# Patient Record
Sex: Female | Born: 1950 | Race: Black or African American | Hispanic: No | Marital: Single | Smoking: Former smoker
Health system: Southern US, Community
[De-identification: ages and names within clinical notes are randomized; demographics above are authoritative.]

## PROBLEM LIST (undated history)

## (undated) DIAGNOSIS — I447 Left bundle-branch block, unspecified: Secondary | ICD-10-CM

## (undated) DIAGNOSIS — I10 Essential (primary) hypertension: Secondary | ICD-10-CM

## (undated) DIAGNOSIS — I2510 Atherosclerotic heart disease of native coronary artery without angina pectoris: Secondary | ICD-10-CM

## (undated) DIAGNOSIS — E785 Hyperlipidemia, unspecified: Secondary | ICD-10-CM

## (undated) DIAGNOSIS — E119 Type 2 diabetes mellitus without complications: Secondary | ICD-10-CM

## (undated) HISTORY — DX: Atherosclerotic heart disease of native coronary artery without angina pectoris: I25.10

## (undated) HISTORY — DX: Type 2 diabetes mellitus without complications: E11.9

## (undated) HISTORY — PX: ABDOMINAL HYSTERECTOMY: SHX81

## (undated) HISTORY — DX: Essential (primary) hypertension: I10

## (undated) HISTORY — DX: Hyperlipidemia, unspecified: E78.5

## (undated) HISTORY — DX: Left bundle-branch block, unspecified: I44.7

---

## 2020-12-04 DIAGNOSIS — Z131 Encounter for screening for diabetes mellitus: Secondary | ICD-10-CM | POA: Diagnosis not present

## 2020-12-04 DIAGNOSIS — Z1321 Encounter for screening for nutritional disorder: Secondary | ICD-10-CM | POA: Diagnosis not present

## 2020-12-04 DIAGNOSIS — Z136 Encounter for screening for cardiovascular disorders: Secondary | ICD-10-CM | POA: Diagnosis not present

## 2020-12-04 DIAGNOSIS — Z0001 Encounter for general adult medical examination with abnormal findings: Secondary | ICD-10-CM | POA: Diagnosis not present

## 2020-12-04 DIAGNOSIS — I10 Essential (primary) hypertension: Secondary | ICD-10-CM | POA: Diagnosis not present

## 2020-12-04 DIAGNOSIS — Z1329 Encounter for screening for other suspected endocrine disorder: Secondary | ICD-10-CM | POA: Diagnosis not present

## 2020-12-11 ENCOUNTER — Encounter: Payer: Self-pay | Admitting: Gastroenterology

## 2020-12-12 DIAGNOSIS — E782 Mixed hyperlipidemia: Secondary | ICD-10-CM | POA: Diagnosis not present

## 2020-12-12 DIAGNOSIS — R7303 Prediabetes: Secondary | ICD-10-CM | POA: Diagnosis not present

## 2020-12-12 DIAGNOSIS — I10 Essential (primary) hypertension: Secondary | ICD-10-CM | POA: Diagnosis not present

## 2020-12-12 DIAGNOSIS — D751 Secondary polycythemia: Secondary | ICD-10-CM | POA: Diagnosis not present

## 2020-12-13 ENCOUNTER — Other Ambulatory Visit: Payer: Self-pay | Admitting: Internal Medicine

## 2020-12-13 DIAGNOSIS — R5381 Other malaise: Secondary | ICD-10-CM

## 2020-12-13 DIAGNOSIS — Z1231 Encounter for screening mammogram for malignant neoplasm of breast: Secondary | ICD-10-CM

## 2020-12-18 NOTE — Progress Notes (Signed)
Date:  12/19/2020   ID:  Zoe Cooper, DOB 09/05/1950, MRN [id number]  PCP:  Zoe Mccreedy, MD  Cardiologist:  Zoe Kras, DO, [facility]  (established care 12/19/2020)  REASON FOR CONSULT: Abnormal electrocardiogram   REQUESTING PHYSICIAN:  Zoe Mccreedy, MD [address] [place],  [place] [address]  Chief Complaint  Patient presents with   Abnormal ECG   New Patient (Initial Visit)    HPI  Zoe Cooper is a 70 y.o. female who presents to the office with a chief complaint of " abnormal EKG and establish care." Patient's past medical history and cardiovascular risk factors include: Hypertension, left bundle branch block, post menopausal female, advance age.   She is referred to the office at the request of Zoe Cooper, Zoe Beard, MD for evaluation of abnormal electrocardiogram.  Patient recently moved from [facility] to [place] and established care with PCP and now referred to cardiology for evaluation of an abnormal electrocardiogram and hypertensive heart disease.  Patient denies any chest pain or shortness of breath at rest or with effort related activities.  EKG notes sinus bradycardia with underlying left bundle branch block.  The chronicity of the left bundle branch block is unknown at the current time.  She denies any history of CAD/CVA/PE/DVT/congestive heart failure.  Review of systems are positive for lightheaded and dizziness.  She takes all her medications in the morning.  FUNCTIONAL STATUS: No structured exercise program or daily routine.   ALLERGIES: No Known Allergies  MEDICATION LIST PRIOR TO VISIT: Current Meds  Medication Sig   amLODipine-olmesartan (AZOR) 5-20 MG tablet Take 1 tablet by mouth daily.   diphenhydramine-acetaminophen (TYLENOL PM EXTRA STRENGTH) 25-500 MG TABS tablet Take 1 tablet by mouth at bedtime as needed.   hydrochlorothiazide (HYDRODIURIL) 25 MG tablet Take 1 tablet (25 mg total) by mouth every morning.    metoprolol succinate (TOPROL XL) 25 MG 24 hr tablet Take 1 tablet (25 mg total) by mouth every morning.   [DISCONTINUED] atenolol-chlorthalidone (TENORETIC) 50-25 MG tablet Take 1 tablet by mouth daily.     PAST MEDICAL HISTORY: Past Medical History:  Diagnosis Date   Hypertension    LBBB (left bundle branch block)     PAST SURGICAL HISTORY: Past Surgical History:  Procedure Laterality Date   ABDOMINAL HYSTERECTOMY      FAMILY HISTORY: The patient family history includes Aneurysm in her sister; Cancer in her sister; Heart attack in her father; Lung cancer in her brother.  SOCIAL HISTORY:  The patient  reports that she has quit smoking. Her smoking use included cigarettes. She smoked an average of .25 packs per day. She has never used smokeless tobacco. She reports previous alcohol use. She reports that she does not use drugs.  REVIEW OF SYSTEMS: Review of Systems  Constitutional: Negative for chills and fever.  HENT:  Negative for hoarse voice and nosebleeds.   Eyes:  Negative for discharge, double vision and pain.  Cardiovascular:  Negative for chest pain, claudication, dyspnea on exertion, leg swelling, near-syncope, orthopnea, palpitations, paroxysmal nocturnal dyspnea and syncope.  Respiratory:  Negative for hemoptysis and shortness of breath.   Musculoskeletal:  Negative for muscle cramps and myalgias.  Gastrointestinal:  Negative for abdominal pain, constipation, diarrhea, hematemesis, hematochezia, melena, nausea and vomiting.  Neurological:  Positive for light-headedness. Negative for dizziness.   PHYSICAL EXAM: Vitals with BMI 12/19/2020  Height 5' 6"  Weight 182 lbs  BMI 19.62  Systolic 229  Diastolic 78  Pulse 51  CONSTITUTIONAL: Well-developed and well-nourished. No acute distress.  SKIN: Skin is warm and dry. No rash noted. No cyanosis. No pallor. No jaundice HEAD: Normocephalic and atraumatic.  EYES: No scleral icterus MOUTH/THROAT: Moist oral membranes.   NECK: No JVD present. No thyromegaly noted. No carotid bruits  LYMPHATIC: No visible cervical adenopathy.  CHEST Normal respiratory effort. No intercostal retractions  LUNGS: Clear to auscultation bilaterally.  No stridor. No wheezes. No rales.  CARDIOVASCULAR: Regular, bradycardia, positive S1-S2, no murmurs rubs or gallops appreciated. ABDOMINAL: Soft, nontender, nondistended, positive bowel sounds all 4 quadrants, no apparent ascites.  EXTREMITIES: No peripheral edema  HEMATOLOGIC: No significant bruising NEUROLOGIC: Oriented to person, place, and time. Nonfocal. Normal muscle tone.  PSYCHIATRIC: Normal mood and affect. Normal behavior. Cooperative  CARDIAC DATABASE: EKG: 12/19/2020: Marked sinus bradycardia, 49 bpm, left axis deviation, left bundle branch block.    Echocardiogram: No results found for this or any previous visit from the past 1095 days.   Stress Testing: No results found for this or any previous visit from the past 1095 days.  Heart Catheterization: None  LABORATORY DATA: No flowsheet data found.  No flowsheet data found.  Lipid Panel  No results found for: CHOL, TRIG, HDL, CHOLHDL, VLDL, LDLCALC, LDLDIRECT, LABVLDL  No components found for: NTPROBNP No results for input(s): PROBNP in the last 8760 hours. No results for input(s): TSH in the last 8760 hours.  BMP No results for input(s): NA, K, CL, CO2, GLUCOSE, BUN, CREATININE, CALCIUM, GFRNONAA, GFRAA in the last 8760 hours.  HEMOGLOBIN A1C No results found for: HGBA1C, MPG  External Labs:  Date Collected: 12/09/2020 , information obtained by PCP Potassium: 4.0 Creatinine 0.97 mg/dL. eGFR: 63 mL/min per 1.73 m Hemoglobin: 15.6 g/dL and hematocrit: 47.7 % Lipid profile: Total cholesterol 198 , triglycerides 90 , HDL 61 , LDL 118 AST: 17 , ALT: 8 , alkaline phosphatase: 66  Hemoglobin A1c: 6.3 TSH: 1.12    IMPRESSION:    ICD-10-CM   1. Nonspecific abnormal electrocardiogram (ECG) (EKG)   R94.31 EKG 12-Lead    2. Bradycardia, sinus  R00.1 PCV MYOCARDIAL PERFUSION WO LEXISCAN    metoprolol succinate (TOPROL XL) 25 MG 24 hr tablet    3. LBBB (left bundle branch block)  I44.7 PCV MYOCARDIAL PERFUSION WO LEXISCAN    4. Benign hypertension  I10 PCV ECHOCARDIOGRAM COMPLETE    metoprolol succinate (TOPROL XL) 25 MG 24 hr tablet    hydrochlorothiazide (HYDRODIURIL) 25 MG tablet    Basic metabolic panel    Magnesium       RECOMMENDATIONS: Solana Coggin is a 70 y.o. female whose past medical history and cardiac risk factors include: Hypertension, left bundle branch block, post menopausal female, advance age.   Patient presents for evaluation of an abnormal EKG.  EKG notes normal sinus rhythm with underlying left bundle branch block.  Patient is unaware of the chronicity of her LBBB.  At times she does experience tired, fatigue, lightheaded and dizziness.  I suspect this is most likely secondary to underlying sinus bradycardia.  Is currently on atenolol 50 mg/chlorthalidone 25 mg p.o. daily.  At her age and given her symptoms would recommend down titrating her medications.  We will transition her to Toprol-XL 25 mg p.o. every morning and hydrochlorothiazide 25 mg p.o. every morning.  Would like her to get blood work done in 1 week to evaluate kidney function and electrolytes.  Given her age, risk factors and left bundle branch block recommend exercise nuclear stress test to  evaluate for underlying reversible ischemia.  I have asked her to hold metoprolol 24 hours prior to stress testing.  Further recommendations to follow as case evolves.  Thank you for allowing [place] to participate in the care of this pleasant patient please do not hesitate to reach out if any questions or concerns arise.  FINAL MEDICATION LIST END OF ENCOUNTER: Meds ordered this encounter  Medications   metoprolol succinate (TOPROL XL) 25 MG 24 hr tablet    Sig: Take 1 tablet (25 mg total) by mouth every morning.     Dispense:  90 tablet    Refill:  0   hydrochlorothiazide (HYDRODIURIL) 25 MG tablet    Sig: Take 1 tablet (25 mg total) by mouth every morning.    Dispense:  90 tablet    Refill:  0     Medications Discontinued During This Encounter  Medication Reason   atenolol-chlorthalidone (TENORETIC) 50-25 MG tablet      Current Outpatient Medications:    amLODipine-olmesartan (AZOR) 5-20 MG tablet, Take 1 tablet by mouth daily., Disp: , Rfl:    diphenhydramine-acetaminophen (TYLENOL PM EXTRA STRENGTH) 25-500 MG TABS tablet, Take 1 tablet by mouth at bedtime as needed., Disp: , Rfl:    hydrochlorothiazide (HYDRODIURIL) 25 MG tablet, Take 1 tablet (25 mg total) by mouth every morning., Disp: 90 tablet, Rfl: 0   metoprolol succinate (TOPROL XL) 25 MG 24 hr tablet, Take 1 tablet (25 mg total) by mouth every morning., Disp: 90 tablet, Rfl: 0  Orders Placed This Encounter  Procedures   Basic metabolic panel   Magnesium   PCV MYOCARDIAL PERFUSION WO LEXISCAN   EKG 12-Lead   PCV ECHOCARDIOGRAM COMPLETE    Patient Instructions  Please provide the patient instructions on the nearest [facility].  Hold Metoprolol for 24hr before your stress test.    --Continue cardiac medications as reconciled in final medication list. --Return in about 6 weeks (around 01/30/2021) for Follow up LBBB, Review test results. Or sooner if needed. --Continue follow-up with your primary care physician regarding the management of your other chronic comorbid conditions.  Patient's questions and concerns were addressed to her satisfaction. She voices understanding of the instructions provided during this encounter.   This note was created using a voice recognition software as a result there may be grammatical errors inadvertently enclosed that do not reflect the nature of this encounter. Every attempt is made to correct such errors.  Zoe Cooper, [place], [facility]  Pager: [phone] Office: [phone]

## 2020-12-19 ENCOUNTER — Ambulatory Visit: Payer: Medicare Other | Admitting: Cardiology

## 2020-12-19 ENCOUNTER — Encounter: Payer: Self-pay | Admitting: Cardiology

## 2020-12-19 ENCOUNTER — Other Ambulatory Visit: Payer: Self-pay

## 2020-12-19 VITALS — BP 146/78 | HR 51 | Temp 97.6°F | Resp 16 | Ht 66.0 in | Wt 182.0 lb

## 2020-12-19 DIAGNOSIS — R001 Bradycardia, unspecified: Secondary | ICD-10-CM | POA: Diagnosis not present

## 2020-12-19 DIAGNOSIS — I447 Left bundle-branch block, unspecified: Secondary | ICD-10-CM | POA: Diagnosis not present

## 2020-12-19 DIAGNOSIS — R9431 Abnormal electrocardiogram [ECG] [EKG]: Secondary | ICD-10-CM | POA: Diagnosis not present

## 2020-12-19 DIAGNOSIS — I10 Essential (primary) hypertension: Secondary | ICD-10-CM | POA: Diagnosis not present

## 2020-12-19 MED ORDER — METOPROLOL SUCCINATE ER 25 MG PO TB24: 25.0000 mg | ORAL_TABLET | Freq: Every morning | ORAL | 0 refills | Status: DC

## 2020-12-19 MED ORDER — HYDROCHLOROTHIAZIDE 25 MG PO TABS: 25.0000 mg | ORAL_TABLET | Freq: Every morning | ORAL | 0 refills | Status: DC

## 2020-12-19 NOTE — Patient Instructions (Addendum)
Please provide the patient instructions on the nearest [facility].  Hold Metoprolol for 24hr before your stress test.

## 2020-12-23 ENCOUNTER — Other Ambulatory Visit: Payer: Self-pay

## 2020-12-23 DIAGNOSIS — I10 Essential (primary) hypertension: Secondary | ICD-10-CM

## 2020-12-26 ENCOUNTER — Other Ambulatory Visit: Payer: Medicare Other

## 2021-01-13 ENCOUNTER — Other Ambulatory Visit: Payer: Medicare Other

## 2021-01-30 ENCOUNTER — Ambulatory Visit: Payer: Medicare Other | Admitting: Cardiology

## 2021-02-03 ENCOUNTER — Ambulatory Visit: Payer: Self-pay

## 2021-02-19 ENCOUNTER — Other Ambulatory Visit: Payer: Medicare Other

## 2021-02-24 ENCOUNTER — Encounter: Payer: Self-pay | Admitting: Gastroenterology

## 2021-02-27 ENCOUNTER — Ambulatory Visit: Payer: Medicare Other | Admitting: Cardiology

## 2021-03-03 ENCOUNTER — Other Ambulatory Visit: Payer: Self-pay

## 2021-03-03 ENCOUNTER — Ambulatory Visit: Payer: Medicare Other

## 2021-03-03 DIAGNOSIS — I10 Essential (primary) hypertension: Secondary | ICD-10-CM

## 2021-03-03 DIAGNOSIS — R001 Bradycardia, unspecified: Secondary | ICD-10-CM | POA: Diagnosis not present

## 2021-03-03 DIAGNOSIS — I447 Left bundle-branch block, unspecified: Secondary | ICD-10-CM | POA: Diagnosis not present

## 2021-03-17 NOTE — Progress Notes (Signed)
Patient is aware 

## 2021-03-18 ENCOUNTER — Other Ambulatory Visit: Payer: Self-pay | Admitting: Cardiology

## 2021-03-18 ENCOUNTER — Ambulatory Visit: Payer: Medicare Other | Admitting: Cardiology

## 2021-03-18 ENCOUNTER — Other Ambulatory Visit: Payer: Self-pay

## 2021-03-18 ENCOUNTER — Encounter: Payer: Self-pay | Admitting: Cardiology

## 2021-03-18 VITALS — BP 129/89 | HR 98 | Resp 16 | Ht 66.0 in | Wt 186.0 lb

## 2021-03-18 DIAGNOSIS — I447 Left bundle-branch block, unspecified: Secondary | ICD-10-CM | POA: Diagnosis not present

## 2021-03-18 DIAGNOSIS — I10 Essential (primary) hypertension: Secondary | ICD-10-CM

## 2021-03-18 DIAGNOSIS — R9439 Abnormal result of other cardiovascular function study: Secondary | ICD-10-CM | POA: Diagnosis not present

## 2021-03-18 DIAGNOSIS — R9431 Abnormal electrocardiogram [ECG] [EKG]: Secondary | ICD-10-CM

## 2021-03-18 DIAGNOSIS — R001 Bradycardia, unspecified: Secondary | ICD-10-CM | POA: Diagnosis not present

## 2021-03-18 MED ORDER — METOPROLOL TARTRATE 50 MG PO TABS
50.0000 mg | ORAL_TABLET | Freq: Two times a day (BID) | ORAL | 0 refills | Status: DC
Start: 2021-03-18 — End: 2021-05-07

## 2021-03-18 NOTE — Progress Notes (Signed)
 Date:  03/18/2021   ID:  Zoe Cooper, DOB 07/30/1950, MRN [id number]  PCP:  Osei-Bonsu, George, MD  Cardiologist:  Sunit Tolia, DO, FACC  (established care 12/19/2020)  Date: 03/18/21 Last Office Visit: 12/19/2020   Chief Complaint  Patient presents with   Left bundle branch block   Results   Follow-up    HPI  Zoe Cooper is a 70 y.o. female who presents to the office with a chief complaint of " follow up to review test results." Patient's past medical history and cardiovascular risk factors include: Hypertension, left bundle branch block, post menopausal female, advance age.   She is referred to the office at the request of Osei-Bonsu, George, MD for evaluation of abnormal electrocardiogram.  After recently moving from [place] to [place] she will establish care with PCP and was referred to cardiology given the abnormal EKG.  Given her underlying left bundle branch block of unknown chronicity and prolonged hypertension she underwent an echocardiogram and stress test with further evaluation and management.  The echocardiogram noted hyperdynamic LVEF with severe left ventricular hypertrophy and nuclear stress test notes perfusion defect involving the inferior and inferoseptal myocardium but overall sensitivity is reduced due to breast tissue attenuation artifact, LVH, and left bundle branch block.  Clinically she denies any chest pain at rest or with effort and rate activities.    FUNCTIONAL STATUS: No structured exercise program or daily routine.   ALLERGIES: No Known Allergies  MEDICATION LIST PRIOR TO VISIT: Current Meds  Medication Sig   amLODipine-olmesartan (AZOR) 5-20 MG tablet Take 1 tablet by mouth daily.   diphenhydramine-acetaminophen (TYLENOL PM EXTRA STRENGTH) 25-500 MG TABS tablet Take 1 tablet by mouth at bedtime as needed.   hydrochlorothiazide (HYDRODIURIL) 25 MG tablet Take 1 tablet (25 mg total) by mouth every morning.   metoprolol tartrate  (LOPRESSOR) 50 MG tablet Take 1 tablet (50 mg total) by mouth 2 (two) times daily. Hold if systolic blood pressure (top number) less than 100 mmHg or pulse less than 60 bpm.   [DISCONTINUED] metoprolol succinate (TOPROL XL) 25 MG 24 hr tablet Take 1 tablet (25 mg total) by mouth every morning.     PAST MEDICAL HISTORY: Past Medical History:  Diagnosis Date   Hypertension    LBBB (left bundle branch block)     PAST SURGICAL HISTORY: Past Surgical History:  Procedure Laterality Date   ABDOMINAL HYSTERECTOMY      FAMILY HISTORY: The patient family history includes Aneurysm in her sister; Cancer in her sister; Heart attack in her father; Lung cancer in her brother.  SOCIAL HISTORY:  The patient  reports that she has quit smoking. Her smoking use included cigarettes. She smoked an average of .25 packs per day. She has never used smokeless tobacco. She reports that she does not currently use alcohol. She reports that she does not use drugs.  REVIEW OF SYSTEMS: Review of Systems  Constitutional: Negative for chills and fever.  HENT:  Negative for hoarse voice and nosebleeds.   Eyes:  Negative for discharge, double vision and pain.  Cardiovascular:  Negative for chest pain, claudication, dyspnea on exertion, leg swelling, near-syncope, orthopnea, palpitations, paroxysmal nocturnal dyspnea and syncope.  Respiratory:  Negative for hemoptysis and shortness of breath.   Musculoskeletal:  Negative for muscle cramps and myalgias.  Gastrointestinal:  Negative for abdominal pain, constipation, diarrhea, hematemesis, hematochezia, melena, nausea and vomiting.  Neurological:  Positive for light-headedness. Negative for dizziness.   PHYSICAL EXAM: Vitals with BMI 03/18/2021   03/18/2021 12/19/2020  Height - 5' 6" 5' 6"  Weight - 186 lbs 182 lbs  BMI - 40.81 44.81  Systolic 856 314 970  Diastolic 89 91 78  Pulse 98 101 51    CONSTITUTIONAL: Well-developed and well-nourished. No acute distress.   SKIN: Skin is warm and dry. No rash noted. No cyanosis. No pallor. No jaundice HEAD: Normocephalic and atraumatic.  EYES: No scleral icterus MOUTH/THROAT: Moist oral membranes.  NECK: No JVD present. No thyromegaly noted. No carotid bruits  LYMPHATIC: No visible cervical adenopathy.  CHEST Normal respiratory effort. No intercostal retractions  LUNGS: Clear to auscultation bilaterally.  No stridor. No wheezes. No rales.  CARDIOVASCULAR: Regular, bradycardia, positive S1-S2, no murmurs rubs or gallops appreciated. ABDOMINAL: Soft, nontender, nondistended, positive bowel sounds all 4 quadrants, no apparent ascites.  EXTREMITIES: No peripheral edema  HEMATOLOGIC: No significant bruising NEUROLOGIC: Oriented to person, place, and time. Nonfocal. Normal muscle tone.  PSYCHIATRIC: Normal mood and affect. Normal behavior. Cooperative  CARDIAC DATABASE: EKG: 12/19/2020: Marked sinus bradycardia, 49 bpm, left axis deviation, left bundle branch block.    Echocardiogram: 03/03/2021:  Hyperdynamic LV systolic function with visual EF >70%. Left ventricle cavity is normal in size. Severe left ventricular hypertrophy. Normal global wall motion. Normal diastolic filling pattern, normal LAP.  Mild tricuspid regurgitation. No evidence of pulmonary hypertension.  No prior study for comparison.   Stress Testing: Lexiscan/modified Bruce Tetrofosmin stress test 03/03/2021: Lexiscan/modified Bruce nuclear stress test performed using 1-day protocol. Patient walked for 2.2 METS and reached 71% MPHR. No chest pain reported, dyspnea reported.  SPECT images show significant breast tissue attenuation in inferior myocardium , with imaging performed in sitting position. Thus, ischemia in this region cannot be excluded.  Stress LVEF 60%.  Given TID of 1.3, clinical correlation is recommended.   Heart Catheterization: None  LABORATORY DATA: No flowsheet data found.  No flowsheet data found.  Lipid Panel  No  results found for: CHOL, TRIG, HDL, CHOLHDL, VLDL, LDLCALC, LDLDIRECT, LABVLDL  No components found for: NTPROBNP No results for input(s): PROBNP in the last 8760 hours. No results for input(s): TSH in the last 8760 hours.  BMP No results for input(s): NA, K, CL, CO2, GLUCOSE, BUN, CREATININE, CALCIUM, GFRNONAA, GFRAA in the last 8760 hours.  HEMOGLOBIN A1C No results found for: HGBA1C, MPG  External Labs:  Date Collected: 12/09/2020 , information obtained by PCP Potassium: 4.0 Creatinine 0.97 mg/dL. eGFR: 63 mL/min per 1.73 m Hemoglobin: 15.6 g/dL and hematocrit: 47.7 % Lipid profile: Total cholesterol 198 , triglycerides 90 , HDL 61 , LDL 118 AST: 17 , ALT: 8 , alkaline phosphatase: 66  Hemoglobin A1c: 6.3 TSH: 1.12    IMPRESSION:    ICD-10-CM   1. Abnormal nuclear stress test  R94.39 metoprolol tartrate (LOPRESSOR) 50 MG tablet    Basic metabolic panel    CT CORONARY MORPH W/CTA COR W/SCORE W/CA W/CM &/OR WO/CM    2. LBBB (left bundle branch block)  I44.7 metoprolol tartrate (LOPRESSOR) 50 MG tablet    CT CORONARY MORPH W/CTA COR W/SCORE W/CA W/CM &/OR WO/CM    3. Benign hypertension  I10     4. Bradycardia, sinus  R00.1     5. Abnormal electrocardiogram  R94.31         RECOMMENDATIONS: Zamorah Ailes is a 70 y.o. female whose past medical history and cardiac risk factors include: Hypertension, left bundle branch block, post menopausal female, advance age.   Patient was referred to the office for  evaluation given her underlying LVH bundle branch block.  Given her unknown chronicity of LBBB she underwent stress testing to evaluate for reversible ischemia.  She was noted to have perfusion defect involving the inferior/inferoseptal segments.  Results including the images were reviewed with the patient in great detail at today's office visit.  I explained to her in great detail that the perfusion defect involving the septum will is most likely secondary to LBBB; however,  the perfusion defect involving the inferior myocardium may be due to questionable ischemia, LVH, and soft tissue attenuation due to breast as the images were taken in the sitting position.    We discussed up titration of antianginal therapy and watching her closely with frequent follow-up visits versus proceeding with coronary CTA to evaluate for obstructive disease.  Patient states that she would like to proceed with coronary CTA.  Check BMP.  Discontinue Toprol-XL 25 mg p.o. daily.  We will start Lopressor 50 mg p.o. twice daily with holding parameters.    Her LVH noted on surface echocardiogram was most likely secondary to uncontrolled hypertension in the past.  We discussed considering cardiac MRI to evaluate for HCM versus infiltrative cardiomyopathy; however, she would like to hold off on cardiac MRI at this time.  Further recommendations to follow.  FINAL MEDICATION LIST END OF ENCOUNTER: Meds ordered this encounter  Medications   metoprolol tartrate (LOPRESSOR) 50 MG tablet    Sig: Take 1 tablet (50 mg total) by mouth 2 (two) times daily. Hold if systolic blood pressure (top number) less than 100 mmHg or pulse less than 60 bpm.    Dispense:  60 tablet    Refill:  0     Medications Discontinued During This Encounter  Medication Reason   metoprolol succinate (TOPROL XL) 25 MG 24 hr tablet Change in therapy    Current Outpatient Medications:    amLODipine-olmesartan (AZOR) 5-20 MG tablet, Take 1 tablet by mouth daily., Disp: , Rfl:    diphenhydramine-acetaminophen (TYLENOL PM EXTRA STRENGTH) 25-500 MG TABS tablet, Take 1 tablet by mouth at bedtime as needed., Disp: , Rfl:    hydrochlorothiazide (HYDRODIURIL) 25 MG tablet, Take 1 tablet (25 mg total) by mouth every morning., Disp: 90 tablet, Rfl: 0   metoprolol tartrate (LOPRESSOR) 50 MG tablet, Take 1 tablet (50 mg total) by mouth 2 (two) times daily. Hold if systolic blood pressure (top number) less than 100 mmHg or pulse less than  60 bpm., Disp: 60 tablet, Rfl: 0  Orders Placed This Encounter  Procedures   CT CORONARY MORPH W/CTA COR W/SCORE W/CA W/CM &/OR WO/CM   Basic metabolic panel    There are no Patient Instructions on file for this visit.   --Continue cardiac medications as reconciled in final medication list. --Return in about 4 weeks (around 04/15/2021) for Review CCTA. Or sooner if needed. --Continue follow-up with your primary care physician regarding the management of your other chronic comorbid conditions.  Patient's questions and concerns were addressed to her satisfaction. She voices understanding of the instructions provided during this encounter.   This note was created using a voice recognition software as a result there may be grammatical errors inadvertently enclosed that do not reflect the nature of this encounter. Every attempt is made to correct such errors.  Rex Kras, [place], [facility]  Pager: [phone] Office: [phone]

## 2021-04-09 DIAGNOSIS — R9439 Abnormal result of other cardiovascular function study: Secondary | ICD-10-CM | POA: Diagnosis not present

## 2021-04-10 LAB — BASIC METABOLIC PANEL
BUN/Creatinine Ratio: 16 (ref 12–28)
BUN: 18 mg/dL (ref 8–27)
CO2: 28 mmol/L (ref 20–29)
Calcium: 10.6 mg/dL — ABNORMAL HIGH (ref 8.7–10.3)
Chloride: 97 mmol/L (ref 96–106)
Creatinine, Ser: 1.11 mg/dL — ABNORMAL HIGH (ref 0.57–1.00)
Glucose: 82 mg/dL (ref 70–99)
Potassium: 4.6 mmol/L (ref 3.5–5.2)
Sodium: 143 mmol/L (ref 134–144)
eGFR: 53 mL/min/{1.73_m2} — ABNORMAL LOW (ref >59)

## 2021-04-11 ENCOUNTER — Telehealth (HOSPITAL_COMMUNITY): Payer: Self-pay | Admitting: Emergency Medicine

## 2021-04-11 NOTE — Telephone Encounter (Signed)
Reaching out to patient to offer assistance regarding upcoming cardiac imaging study; pt verbalizes understanding of appt date/time, parking situation and where to check in, pre-test NPO status and medications ordered, and verified current allergies; name and call back number provided for further questions should they arise Rockwell Alexandria RN Navigator Cardiac Imaging [facility] [phone] office [phone] cell  50mg  metop BID Holding HCTZ Arrival time 130p Denies IV issues

## 2021-04-15 ENCOUNTER — Ambulatory Visit (HOSPITAL_COMMUNITY)
Admission: RE | Admit: 2021-04-15 | Discharge: 2021-04-15 | Disposition: A | Discharge status: Home / Self Care | Payer: Medicare Other | Source: Ambulatory Visit | Attending: Cardiology | Admitting: Cardiology

## 2021-04-15 ENCOUNTER — Other Ambulatory Visit: Payer: Self-pay

## 2021-04-15 DIAGNOSIS — I700 Atherosclerosis of aorta: Secondary | ICD-10-CM | POA: Diagnosis not present

## 2021-04-15 DIAGNOSIS — I2510 Atherosclerotic heart disease of native coronary artery without angina pectoris: Secondary | ICD-10-CM | POA: Insufficient documentation

## 2021-04-15 DIAGNOSIS — R9439 Abnormal result of other cardiovascular function study: Secondary | ICD-10-CM | POA: Insufficient documentation

## 2021-04-15 DIAGNOSIS — I447 Left bundle-branch block, unspecified: Secondary | ICD-10-CM | POA: Diagnosis not present

## 2021-04-15 IMAGING — CT CT HEART MORP W/ CTA COR W/ SCORE W/ CA W/CM &/OR W/O CM
2 of 8 series · 5 of 20 positions shown, 6 images · IV contrast (APPLIED)
Comparison: None.
COMPARISON: None.

Addendum:
EXAM:
OVER-READ INTERPRETATION  CT CHEST

The following report is an over-read performed by radiologist Dr.
over-read does not include interpretation of cardiac or coronary
anatomy or pathology. The coronary CTA interpretation by the
cardiologist is attached.
HISTORY: ECG abnormal, 10yr CHD risk < 10%, not treadmill candidate
Cardiac/Coronary  CT
TECHNIQUE: The patient was scanned on a Siemens Force scanner.
PROTOCOL: A 120 kV prospective scan was triggered in the descending thoracic
aorta at 111 HU's. Axial non-contrast 3 mm slices were carried out
through the heart. The data set was analyzed on a dedicated work
station and scored using the Agatson method. Gantry rotation speed
was 250 msecs and collimation was .6 mm. IV beta blockade and 0.8 mg
of sl NTG was given. The 3D data set was reconstructed in 10%
intervals of the 0-90 % of the R-R cycle. Diastolic phases were
analyzed on a dedicated work station using MPR, MIP and VRT modes.
The patient received 95mL OMNIPAQUE IOHEXOL 350 MG/ML SOLN of
contrast.

[Series 6: ts diast sharp · axial · 0.39mm/px · z∈[+1139,+1172]mm · 2 of 251 slices shown]
[im 84/251  lung]
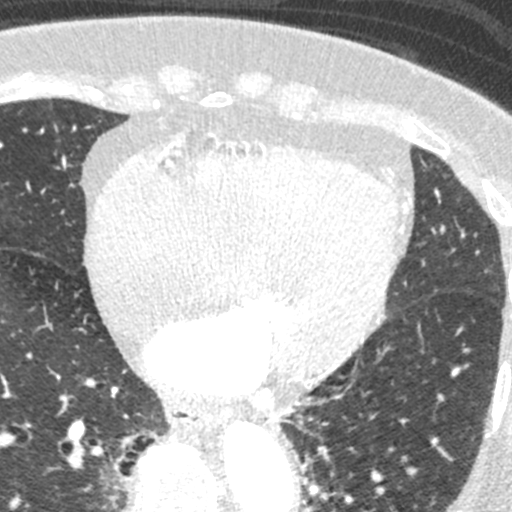
[im 167/251  lung]
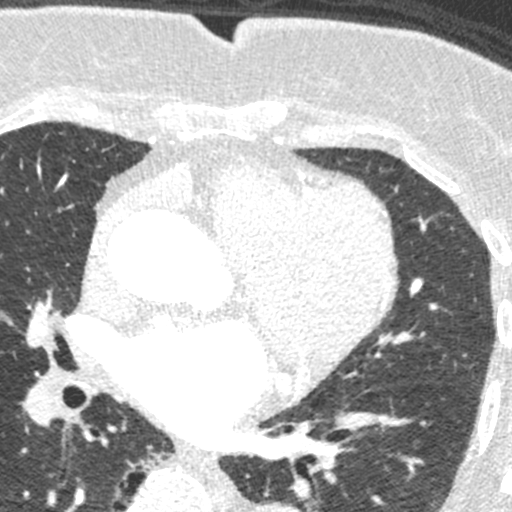

[Series 11: ts diast · axial · 0.39mm/px · z∈[+1104,+1205]mm · 3 of 253 slices shown, 4 images]
[im 1/253  vessel]
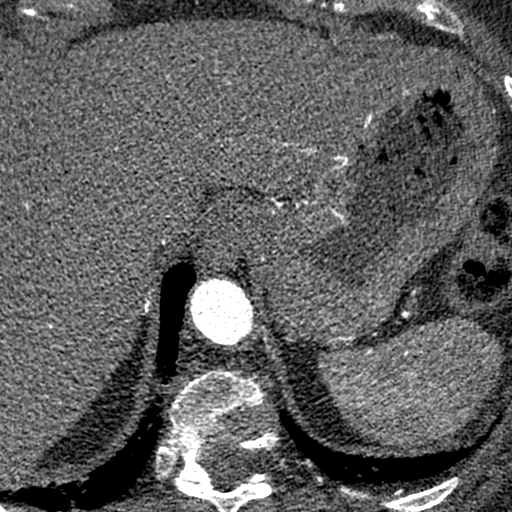
[im 1/253  lung]
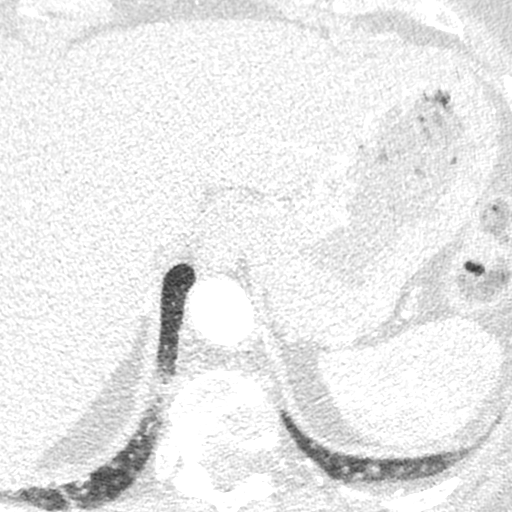
[im 127/253  vessel]
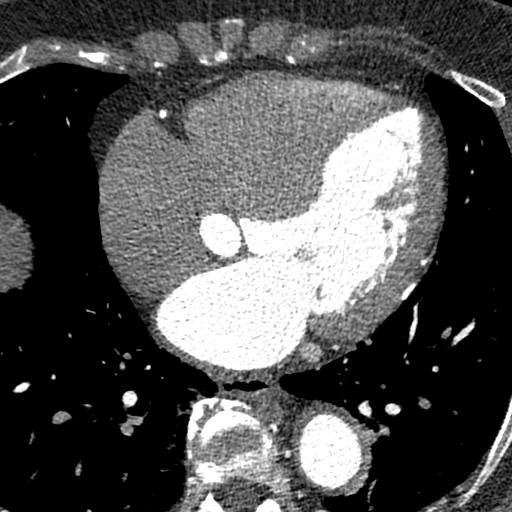
[im 253/253  vessel]
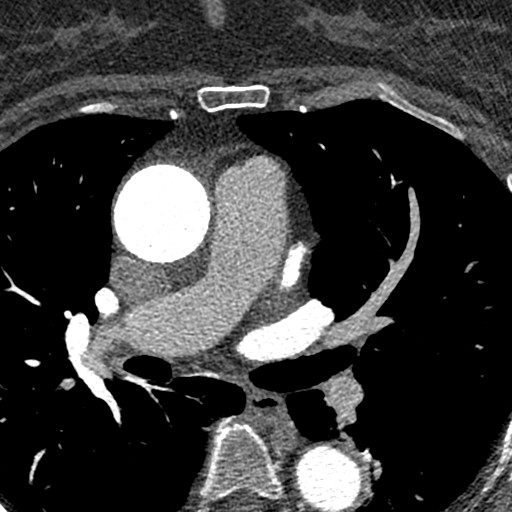

[5 of 20 positions shown; findings below may reference images not displayed]

FINDINGS: Vascular: Aortic atherosclerosis. No central pulmonary embolism, on
this non-dedicated study.

Mediastinum/Nodes: No imaged thoracic adenopathy. Subtle fluid level
in the esophagus including on [DATE].

Lungs/Pleura: No pleural fluid.  Clear imaged lungs.

Upper Abdomen: Normal imaged portions of the liver, spleen, stomach.

Musculoskeletal: No acute osseous abnormality.
IMPRESSION: 1.  No acute findings in the imaged extracardiac chest.
2.  Aortic Atherosclerosis ([id number]-719.9).
3. Esophageal air fluid level suggests dysmotility or
gastroesophageal reflux.
FINDINGS: Image quality: Suboptimal.

Artifact: Present

Coronary artery calcification score:

Left main: 0

Left anterior descending artery: 101

Left circumflex artery: 0

Right coronary artery:

Total coronary calcium score of 104, places the patient at the 78th
percentile for age and sex matched control.

Coronary arteries: Normal coronary origins.  Right dominance.

Left Main Coronary Artery: The left main is a normal caliber vessel
with a normal take off from the left coronary cusp that bifurcates
to form a left anterior descending artery and a left circumflex
artery. There is no plaque or stenosis.

Left Anterior Descending Coronary Artery: Normal caliber vessel,
wraps the apex, gives off 1 patent diagonal branches. Proximal
segment patent, mid segment after the first diagonal branch moderate
stenosis (50-69%) due to mixed plaque, distal to apical LAD is
patent. Mild stenosis (25-49%) at ostial segment of first diagonal
branch, diffuse disease in proximal to mid segment, distal segment
is patent.

Left Circumflex Artery: Normal caliber vessel, non-dominant, travels
within the atrioventricular groove, gives off 1 patent obtuse
marginal branches. The LCX is patent without significant disease.
First obtuse marginal branch is patent.

Right Coronary Artery: The RCA is dominant with normal take off from
the right coronary cusp. The RCA terminates as a PDA and right
posterolateral branch. RCA overall patent with minimal luminal
irregularities in proximal segment, mid segment patent, mild
stenosis (25-49%) within distal RCA due to non-calcified plaque.

Left Atrium: Grossly normal in size with no left atrial appendage
filling defect.

Left Ventricle: Grossly normal in size. There are no stigmata of
prior infarction. There is no abnormal filling defect.

Pulmonary arteries: Normal in size without proximal filling defect.

Pulmonary veins: Normal pulmonary venous drainage.

Aorta: Normal size, 33.9 mm at the mid ascending aorta (level of the
PA bifurcation) measured double oblique. Aortic atherosclerosis. No
dissection.

Pericardium: Normal thickness with no significant effusion or
calcium present.

Cardiac valves: The aortic valve is trileaflet without significant
calcification. The mitral valve is normal structure without
significant calcification.

Extra-cardiac findings: See attached radiology report for
non-cardiac structures.
IMPRESSION: 1. Total coronary calcium score of 104. This was 78th percentile for
age and sex matched control.

2. Normal coronary origin with right dominance.

3. CAD-RADS = 3.

Left Main: Patent.

LAD: Mid segment after the first diagonal branch moderate stenosis
(50-69%) due to mixed plaque, otherwise patent.

D1: Mild stenosis (25-49%) at ostial segment of first diagonal
branch.

LCX: Patent.

OM1: Patent.

RCA: Mild stenosis (25-49%) within distal RCA due to non-calcified
plaque.

4. Aortic atherosclerosis.

5. Study is sent for CT-FFR. Findings will be performed and reported
separately.

RECOMMENDATIONS:

Consider symptom-guided anti-ischemic pharmacotherapy as well as
risk factor modification per guideline directed care. Additional
analysis with CT FFR will be submitted.

*** End of Addendum ***
EXAM:
OVER-READ INTERPRETATION  CT CHEST

The following report is an over-read performed by radiologist Dr.
over-read does not include interpretation of cardiac or coronary
anatomy or pathology. The coronary CTA interpretation by the
cardiologist is attached.
FINDINGS: Vascular: Aortic atherosclerosis. No central pulmonary embolism, on
this non-dedicated study.

Mediastinum/Nodes: No imaged thoracic adenopathy. Subtle fluid level
in the esophagus including on [DATE].

Lungs/Pleura: No pleural fluid.  Clear imaged lungs.

Upper Abdomen: Normal imaged portions of the liver, spleen, stomach.

Musculoskeletal: No acute osseous abnormality.
IMPRESSION: 1.  No acute findings in the imaged extracardiac chest.
2.  Aortic Atherosclerosis (9F64Z-719.9).
3. Esophageal air fluid level suggests dysmotility or
gastroesophageal reflux.

## 2021-04-15 MED ORDER — NITROGLYCERIN 0.4 MG SL SUBL
0.8000 mg | SUBLINGUAL_TABLET | Freq: Once | SUBLINGUAL | Status: AC
  Administered 2021-04-15: 0.8 mg via SUBLINGUAL

## 2021-04-15 MED ORDER — IOHEXOL 350 MG/ML SOLN
95.0000 mL | Freq: Once | INTRAVENOUS | Status: AC | PRN
  Administered 2021-04-15: 95 mL via INTRAVENOUS

## 2021-04-15 MED ORDER — METOPROLOL TARTRATE 5 MG/5ML IV SOLN
5.0000 mg | INTRAVENOUS | Status: DC | PRN
Start: 2021-04-15 — End: 2021-04-16
  Administered 2021-04-15: 5 mg via INTRAVENOUS

## 2021-04-15 NOTE — Progress Notes (Signed)
Called pt, no answer. Left vm requesting call back?

## 2021-04-16 ENCOUNTER — Ambulatory Visit (HOSPITAL_COMMUNITY)
Admission: RE | Admit: 2021-04-16 | Discharge: 2021-04-16 | Disposition: A | Discharge status: Home / Self Care | Payer: Medicare Other | Source: Ambulatory Visit | Attending: Cardiology | Admitting: Cardiology

## 2021-04-16 ENCOUNTER — Other Ambulatory Visit: Payer: Self-pay | Admitting: Cardiology

## 2021-04-16 DIAGNOSIS — R931 Abnormal findings on diagnostic imaging of heart and coronary circulation: Secondary | ICD-10-CM | POA: Insufficient documentation

## 2021-04-16 NOTE — Progress Notes (Signed)
Called and spoke to pt, pt voiced understanding.

## 2021-04-17 ENCOUNTER — Ambulatory Visit: Payer: Medicare Other | Admitting: Cardiology

## 2021-04-18 ENCOUNTER — Other Ambulatory Visit: Payer: Self-pay | Admitting: Internal Medicine

## 2021-04-18 DIAGNOSIS — E2839 Other primary ovarian failure: Secondary | ICD-10-CM

## 2021-04-18 DIAGNOSIS — M810 Age-related osteoporosis without current pathological fracture: Secondary | ICD-10-CM

## 2021-04-22 DIAGNOSIS — R9439 Abnormal result of other cardiovascular function study: Secondary | ICD-10-CM | POA: Insufficient documentation

## 2021-04-22 DIAGNOSIS — I447 Left bundle-branch block, unspecified: Secondary | ICD-10-CM | POA: Insufficient documentation

## 2021-04-22 NOTE — Progress Notes (Signed)
Called pt to infomr her about her ct scan. Pt understood

## 2021-04-25 NOTE — Progress Notes (Signed)
Tried calling patient no answer left a vm to call back

## 2021-04-30 ENCOUNTER — Ambulatory Visit: Payer: Medicare Other | Admitting: Cardiology

## 2021-04-30 NOTE — Progress Notes (Signed)
Called and spoke to pt, pt voiced understanding. Pt will be coming to her appt on 05/07/2021 and will bring in an accurate medication list.

## 2021-05-07 ENCOUNTER — Ambulatory Visit: Payer: Medicare Other | Admitting: Cardiology

## 2021-05-07 ENCOUNTER — Other Ambulatory Visit: Payer: Self-pay

## 2021-05-07 ENCOUNTER — Encounter: Payer: Self-pay | Admitting: Cardiology

## 2021-05-07 VITALS — BP 137/90 | HR 88 | Temp 98.4°F | Resp 17 | Ht 66.0 in | Wt 190.6 lb

## 2021-05-07 DIAGNOSIS — R931 Abnormal findings on diagnostic imaging of heart and coronary circulation: Secondary | ICD-10-CM | POA: Diagnosis not present

## 2021-05-07 DIAGNOSIS — I447 Left bundle-branch block, unspecified: Secondary | ICD-10-CM | POA: Diagnosis not present

## 2021-05-07 DIAGNOSIS — I2510 Atherosclerotic heart disease of native coronary artery without angina pectoris: Secondary | ICD-10-CM | POA: Diagnosis not present

## 2021-05-07 DIAGNOSIS — I10 Essential (primary) hypertension: Secondary | ICD-10-CM

## 2021-05-07 MED ORDER — METOPROLOL SUCCINATE ER 50 MG PO TB24: 50.0000 mg | ORAL_TABLET | Freq: Every morning | ORAL | 0 refills | Status: DC

## 2021-05-07 MED ORDER — OLMESARTAN MEDOXOMIL 40 MG PO TABS: 40.0000 mg | ORAL_TABLET | Freq: Every day | ORAL | 0 refills | Status: DC

## 2021-05-07 MED ORDER — ROSUVASTATIN CALCIUM 20 MG PO TABS: 20.0000 mg | ORAL_TABLET | Freq: Every day | ORAL | 0 refills | Status: DC

## 2021-05-07 MED ORDER — METOPROLOL SUCCINATE ER 50 MG PO TB24: 100.0000 mg | ORAL_TABLET | Freq: Every morning | ORAL | 0 refills | Status: DC

## 2021-05-07 MED ORDER — ASPIRIN EC 81 MG PO TBEC: 81.0000 mg | DELAYED_RELEASE_TABLET | Freq: Every day | ORAL | 11 refills | Status: AC

## 2021-05-07 NOTE — Progress Notes (Signed)
Date:  05/07/2021   ID:  [name], DOB 1951-03-17, MRN [id number]  PCP:  Benito Mccreedy, MD  Cardiologist:  Rex Kras, DO, [facility]  (established care 12/19/2020)  Date: 05/07/21 Last Office Visit: 03/18/2021  Chief Complaint  Patient presents with   Results   Follow-up    4 WEEKSS    HPI  Zoe Cooper is a 70 y.o. female who presents to the office with a chief complaint of " follow up to review test results." Patient's past medical history and cardiovascular risk factors include: Moderate coronary artery calcification, nonobstructive CAD, hypertension, left bundle branch block, post menopausal female, advance age.   She is referred to the office at the request of Osei-Bonsu, Iona Beard, MD for evaluation of abnormal electrocardiogram.  Patient recently moved from [facility] to [place] and was referred to the office by PCP for evaluation and management of an abnormal EKG.  Work-up included an echocardiogram and stress test.  Results reviewed with her in great detail during prior office visit.  At the last office visit we discussed that the patient has inferior and inferoseptal myocardial perfusion defect on recent stress test performed for left bundle branch block.  Educated the patient that overall sensitivity is reduced given the presence of breast tissue attenuation artifact, LVH, and LBBB.  Recommended medical therapy; however, patient wanted to proceed with coronary CTA for further evaluation and management.  Results of the coronary CTA including CT FFR reviewed with her in great detail and noted below for further reference.  Clinically she has not had any chest pain to suggest angina pectoris/equivalents.  Overall euvolemic and not in congestive heart failure.  No hospitalizations or urgent care visits since last office encounter.  Had asked her to increase physical activity as tolerated which she has done and no exertional related chest pain or shortness of  breath.  FUNCTIONAL STATUS: No structured exercise program or daily routine.   ALLERGIES: No Known Allergies  MEDICATION LIST PRIOR TO VISIT: Current Meds  Medication Sig   aspirin EC 81 MG tablet Take 1 tablet (81 mg total) by mouth daily. Swallow whole.   diphenhydramine-acetaminophen (TYLENOL PM EXTRA STRENGTH) 25-500 MG TABS tablet Take 1 tablet by mouth at bedtime as needed.   hydrochlorothiazide (HYDRODIURIL) 25 MG tablet TAKE 1 TABLET BY MOUTH ONCE DAILY IN THE MORNING   metoprolol succinate (TOPROL-XL) 50 MG 24 hr tablet Take 1 tablet (50 mg total) by mouth every morning. Take with or immediately following a meal.   olmesartan (BENICAR) 40 MG tablet Take 1 tablet (40 mg total) by mouth daily at 10 pm.   rosuvastatin (CRESTOR) 20 MG tablet Take 1 tablet (20 mg total) by mouth at bedtime.   [DISCONTINUED] amLODipine-olmesartan (AZOR) 5-20 MG tablet Take 1 tablet by mouth daily.   [DISCONTINUED] metoprolol succinate (TOPROL XL) 50 MG 24 hr tablet Take 2 tablets (100 mg total) by mouth every morning. Take with or immediately following a meal.   [DISCONTINUED] metoprolol tartrate (LOPRESSOR) 50 MG tablet Take 1 tablet (50 mg total) by mouth 2 (two) times daily. Hold if systolic blood pressure (top number) less than 100 mmHg or pulse less than 60 bpm.     PAST MEDICAL HISTORY: Past Medical History:  Diagnosis Date   Hypertension    LBBB (left bundle branch block)     PAST SURGICAL HISTORY: Past Surgical History:  Procedure Laterality Date   ABDOMINAL HYSTERECTOMY      FAMILY HISTORY: The patient family history includes Aneurysm  in her sister; Cancer in her sister; Heart attack in her father; Lung cancer in her brother.  SOCIAL HISTORY:  The patient  reports that she has quit smoking. Her smoking use included cigarettes. She smoked an average of .25 packs per day. She has never used smokeless tobacco. She reports that she does not currently use alcohol. She reports that she  does not use drugs.  REVIEW OF SYSTEMS: Review of Systems  Constitutional: Negative for chills and fever.  HENT:  Negative for hoarse voice and nosebleeds.   Eyes:  Negative for discharge, double vision and pain.  Cardiovascular:  Negative for chest pain, claudication, dyspnea on exertion, leg swelling, near-syncope, orthopnea, palpitations, paroxysmal nocturnal dyspnea and syncope.  Respiratory:  Negative for hemoptysis and shortness of breath.   Musculoskeletal:  Negative for muscle cramps and myalgias.  Gastrointestinal:  Negative for abdominal pain, constipation, diarrhea, hematemesis, hematochezia, melena, nausea and vomiting.  Neurological:  Negative for dizziness and light-headedness.   PHYSICAL EXAM: Vitals with BMI 05/07/2021 04/15/2021 04/15/2021  Height '5\' 6"'  - -  Weight 190 lbs 10 oz - -  BMI 32.67 - -  Systolic 124 580 998  Diastolic 90 87 88  Pulse 88 68 63    CONSTITUTIONAL: Well-developed and well-nourished. No acute distress.  SKIN: Skin is warm and dry. No rash noted. No cyanosis. No pallor. No jaundice HEAD: Normocephalic and atraumatic.  EYES: No scleral icterus MOUTH/THROAT: Moist oral membranes.  NECK: No JVD present. No thyromegaly noted. No carotid bruits  LYMPHATIC: No visible cervical adenopathy.  CHEST Normal respiratory effort. No intercostal retractions  LUNGS: Clear to auscultation bilaterally.  No stridor. No wheezes. No rales.  CARDIOVASCULAR: Regular, bradycardia, positive S1-S2, no murmurs rubs or gallops appreciated. ABDOMINAL: Soft, nontender, nondistended, positive bowel sounds all 4 quadrants, no apparent ascites.  EXTREMITIES: No peripheral edema  HEMATOLOGIC: No significant bruising NEUROLOGIC: Oriented to person, place, and time. Nonfocal. Normal muscle tone.  PSYCHIATRIC: Normal mood and affect. Normal behavior. Cooperative  CARDIAC DATABASE: EKG: 12/19/2020: Marked sinus bradycardia, 49 bpm, left axis deviation, left bundle branch  block.    Echocardiogram: 03/03/2021:  Hyperdynamic LV systolic function with visual EF >70%. Left ventricle cavity is normal in size. Severe left ventricular hypertrophy. Normal global wall motion. Normal diastolic filling pattern, normal LAP.  Mild tricuspid regurgitation. No evidence of pulmonary hypertension.  No prior study for comparison.   Stress Testing: Lexiscan/modified Bruce Tetrofosmin stress test 03/03/2021: Lexiscan/modified Bruce nuclear stress test performed using 1-day protocol. Patient walked for 2.2 METS and reached 71% MPHR. No chest pain reported, dyspnea reported.  SPECT images show significant breast tissue attenuation in inferior myocardium , with imaging performed in sitting position. Thus, ischemia in this region cannot be excluded.  Stress LVEF 60%.  Given TID of 1.3, clinical correlation is recommended.   Heart Catheterization: None  CCTA 04/15/2021: 1. Total coronary calcium score of 104. This was 78th percentile for age and sex matched control. 2. Normal coronary origin with right dominance. 3. CAD-RADS = 3. Left Main: Patent. LAD: Mid segment after the first diagonal branch moderate stenosis (50-69%) due to mixed plaque, otherwise patent. D1: Mild stenosis (25-49%) at ostial segment of first diagonal branch. LCX: Patent. OM1: Patent. RCA: Mild stenosis (25-49%) within distal RCA due to non-calcified plaque. 4. Aortic atherosclerosis. 5. Study is sent for CT-FFR. Findings will be performed and reported separately. 6. No acute findings in the imaged extracardiac chest. 7. Esophageal air fluid level suggests dysmotility or gastroesophageal reflux  RECOMMENDATIONS: Consider symptom-guided anti-ischemic pharmacotherapy as well as risk factor modification per guideline directed care. Additional analysis with CT FFR will be submitted.  CT-FFR 04/23/2021: 1. CT FFR analysis showed significant stenosis within the first diagonal branch.    RECOMMENDATIONS: Recommend goal directed and antianginal therapy with aggressive risk factor modification for secondary prevention of coronary artery disease. However, if still symptomatic consider invasive angiography for further evaluation. Clinical correlation is required  LABORATORY DATA: No flowsheet data found.  CMP Latest Ref Rng & Units 04/09/2021  Glucose 70 - 99 mg/dL 82  BUN 8 - 27 mg/dL 18  Creatinine 0.57 - 1.00 mg/dL 1.11(H)  Sodium 134 - 144 mmol/L 143  Potassium 3.5 - 5.2 mmol/L 4.6  Chloride 96 - 106 mmol/L 97  CO2 20 - 29 mmol/L 28  Calcium 8.7 - 10.3 mg/dL 10.6(H)    Lipid Panel  No results found for: CHOL, TRIG, HDL, CHOLHDL, VLDL, LDLCALC, LDLDIRECT, LABVLDL  No components found for: NTPROBNP No results for input(s): PROBNP in the last 8760 hours. No results for input(s): TSH in the last 8760 hours.  BMP Recent Labs    04/09/21 1326  NA 143  K 4.6  CL 97  CO2 28  GLUCOSE 82  BUN 18  CREATININE 1.11*  CALCIUM 10.6*    HEMOGLOBIN A1C No results found for: HGBA1C, MPG  External Labs:  Date Collected: 12/09/2020 , information obtained by PCP Potassium: 4.0 Creatinine 0.97 mg/dL. eGFR: 63 mL/min per 1.73 m Hemoglobin: 15.6 g/dL and hematocrit: 47.7 % Lipid profile: Total cholesterol 198 , triglycerides 90 , HDL 61 , LDL 118 AST: 17 , ALT: 8 , alkaline phosphatase: 66  Hemoglobin A1c: 6.3 TSH: 1.12    IMPRESSION:    ICD-10-CM   1. Atherosclerosis of native coronary artery of native heart without angina pectoris  I25.10 aspirin EC 81 MG tablet    rosuvastatin (CRESTOR) 20 MG tablet    metoprolol succinate (TOPROL-XL) 50 MG 24 hr tablet    DISCONTINUED: metoprolol succinate (TOPROL XL) 50 MG 24 hr tablet    2. Elevated coronary artery calcium score  R93.1 aspirin EC 81 MG tablet    rosuvastatin (CRESTOR) 20 MG tablet    3. Benign hypertension  I10 olmesartan (BENICAR) 40 MG tablet    Basic metabolic panel    Magnesium    metoprolol  succinate (TOPROL-XL) 50 MG 24 hr tablet    4. LBBB (left bundle branch block)  I44.7     5. Abnormal findings diagnostic imaging of heart and coronary circulation  R93.1         RECOMMENDATIONS: Zoe Cooper is a 70 y.o. female whose past medical history and cardiac risk factors include: Hypertension, left bundle branch block, post menopausal female, advance age.   Atherosclerosis of native coronary artery of native heart without angina pectoris Chest pain-free. Overall euvolemic and not in congestive heart failure Total CAC 104, 70th percentile Coronary CTA: CAD-RADS = 3.  CT FFR notes significant stenosis within the first diagonal branch. Reviewed the findings of the echo, MPI, and coronary CTA with the patient at today's office visit extensively.  Based on CT FFR results patient is noted to have significant stenosis in the first diagonal branch.  However based on coronary CTA the lesion appears to be 25-49% stenosis at the ostial segment.  Given the fact that she is asymptomatic and medication nave the shared decision was to uptitrate medical therapy and to reevaluate her symptoms. Start aspirin 81 mg p.o.  daily Start atorvastatin 20 mg p.o. nightly Increase physical activity as tolerated to goal of 30 minutes a day 5 days a week In the interim if she has new onset of chest pain or shortness of breath would recommend going to the closest ER via EMS for further evaluation. Will continue to monitor.  Elevated coronary artery calcium score Total CAC 104, 70th percentile Start aspirin 81 mg p.o. daily Start atorvastatin 20 mg p.o. nightly Will order fasting lipid profile and CMP prior to the next office visit.  The order still have to be placed.  Benign hypertension Blood pressure within acceptable range. Discontinue Norvasc/olmesartan and Lopressor. Start Toprol-XL 50 mg p.o. every morning Will increased olmesartan to 40 mg p.o. every afternoon Blood work in 1 week to evaluate  kidney function and electrolytes.  Her LVH noted on surface echocardiogram was most likely secondary to uncontrolled hypertension in the past.  We discussed considering cardiac MRI to evaluate for HCM versus infiltrative cardiomyopathy; however, she would like to hold off on cardiac MRI at this time.  Further recommendations to follow.  FINAL MEDICATION LIST END OF ENCOUNTER: Meds ordered this encounter  Medications   aspirin EC 81 MG tablet    Sig: Take 1 tablet (81 mg total) by mouth daily. Swallow whole.    Dispense:  30 tablet    Refill:  11   rosuvastatin (CRESTOR) 20 MG tablet    Sig: Take 1 tablet (20 mg total) by mouth at bedtime.    Dispense:  90 tablet    Refill:  0   DISCONTD: metoprolol succinate (TOPROL XL) 50 MG 24 hr tablet    Sig: Take 2 tablets (100 mg total) by mouth every morning. Take with or immediately following a meal.    Dispense:  180 tablet    Refill:  0   olmesartan (BENICAR) 40 MG tablet    Sig: Take 1 tablet (40 mg total) by mouth daily at 10 pm.    Dispense:  90 tablet    Refill:  0   metoprolol succinate (TOPROL-XL) 50 MG 24 hr tablet    Sig: Take 1 tablet (50 mg total) by mouth every morning. Take with or immediately following a meal.    Dispense:  90 tablet    Refill:  0     Medications Discontinued During This Encounter  Medication Reason   metoprolol tartrate (LOPRESSOR) 50 MG tablet    amLODipine-olmesartan (AZOR) 5-20 MG tablet    metoprolol succinate (TOPROL XL) 50 MG 24 hr tablet Dose change    Current Outpatient Medications:    aspirin EC 81 MG tablet, Take 1 tablet (81 mg total) by mouth daily. Swallow whole., Disp: 30 tablet, Rfl: 11   diphenhydramine-acetaminophen (TYLENOL PM EXTRA STRENGTH) 25-500 MG TABS tablet, Take 1 tablet by mouth at bedtime as needed., Disp: , Rfl:    hydrochlorothiazide (HYDRODIURIL) 25 MG tablet, TAKE 1 TABLET BY MOUTH ONCE DAILY IN THE MORNING, Disp: 90 tablet, Rfl: 0   metoprolol succinate (TOPROL-XL) 50  MG 24 hr tablet, Take 1 tablet (50 mg total) by mouth every morning. Take with or immediately following a meal., Disp: 90 tablet, Rfl: 0   olmesartan (BENICAR) 40 MG tablet, Take 1 tablet (40 mg total) by mouth daily at 10 pm., Disp: 90 tablet, Rfl: 0   rosuvastatin (CRESTOR) 20 MG tablet, Take 1 tablet (20 mg total) by mouth at bedtime., Disp: 90 tablet, Rfl: 0  Orders Placed This Encounter  Procedures  Basic metabolic panel   Magnesium    There are no Patient Instructions on file for this visit.   --Continue cardiac medications as reconciled in final medication list. --Return in about 7 weeks (around 06/25/2021) for Follow up, CAD, Lipid. Or sooner if needed. --Continue follow-up with your primary care physician regarding the management of your other chronic comorbid conditions.  Patient's questions and concerns were addressed to her satisfaction. She voices understanding of the instructions provided during this encounter.   This note was created using a voice recognition software as a result there may be grammatical errors inadvertently enclosed that do not reflect the nature of this encounter. Every attempt is made to correct such errors.  Rex Kras, [place], [facility]  Pager: [phone] Office: [phone]

## 2021-05-13 ENCOUNTER — Telehealth: Payer: Self-pay

## 2021-05-14 NOTE — Telephone Encounter (Signed)
Called and spoke to patient her blood pressure at the time of call was 128/88 mmHg with heart rate 73 bpm. Patient is asymptomatic. She is taking medications as directed at last office visit, including Toprol-XL 50 mg daily and olmestartan 40 mg daily. Blood pressure is within acceptable limits. Will not make changes at this time. Advised patient to keep Feb. Follow up appointment.   Patient will continue to monitor blood pressure and heart rate at home. Will notify our office of issues. Her questions regarding medications were addressed.

## 2021-05-15 NOTE — Telephone Encounter (Signed)
Thanks Celeste,   I was trying to minimize the number of pills she was taking daily and optimize her BP medications.   So recommended Toprol XL 50mg  po qday and stop Lopressor.   Increased her - she will needs labs after being on it for 1 week.   ST

## 2021-05-15 NOTE — Telephone Encounter (Signed)
I did not remind patient of need to have labs done, will you please give her a call and do so.

## 2021-05-16 ENCOUNTER — Other Ambulatory Visit: Payer: Self-pay

## 2021-05-16 DIAGNOSIS — I10 Essential (primary) hypertension: Secondary | ICD-10-CM

## 2021-05-22 DIAGNOSIS — I10 Essential (primary) hypertension: Secondary | ICD-10-CM | POA: Diagnosis not present

## 2021-05-22 DIAGNOSIS — R69 Illness, unspecified: Secondary | ICD-10-CM | POA: Diagnosis not present

## 2021-05-23 LAB — BASIC METABOLIC PANEL
BUN/Creatinine Ratio: 13 (ref 12–28)
BUN: 14 mg/dL (ref 8–27)
CO2: 25 mmol/L (ref 20–29)
Calcium: 10.3 mg/dL (ref 8.7–10.3)
Chloride: 101 mmol/L (ref 96–106)
Creatinine, Ser: 1.1 mg/dL — ABNORMAL HIGH (ref 0.57–1.00)
Glucose: 90 mg/dL (ref 70–99)
Potassium: 4.2 mmol/L (ref 3.5–5.2)
Sodium: 141 mmol/L (ref 134–144)
eGFR: 54 mL/min/{1.73_m2} — ABNORMAL LOW (ref >59)

## 2021-05-25 ENCOUNTER — Other Ambulatory Visit: Payer: Self-pay | Admitting: Cardiology

## 2021-05-25 DIAGNOSIS — I10 Essential (primary) hypertension: Secondary | ICD-10-CM

## 2021-05-27 ENCOUNTER — Telehealth: Payer: Self-pay | Admitting: *Deleted

## 2021-05-27 NOTE — Progress Notes (Signed)
Attempted to call pt, no answer. Left vm requesting call back.

## 2021-05-29 NOTE — Progress Notes (Signed)
Called and spoke to pt, pt voiced understanding and will get labs 06/18/2021 prior to office visit with [place] 06/24/2021.

## 2021-06-02 NOTE — Telephone Encounter (Signed)
error 

## 2021-06-18 ENCOUNTER — Other Ambulatory Visit: Payer: Self-pay | Admitting: Cardiology

## 2021-06-18 DIAGNOSIS — I10 Essential (primary) hypertension: Secondary | ICD-10-CM | POA: Diagnosis not present

## 2021-06-19 LAB — BASIC METABOLIC PANEL
BUN/Creatinine Ratio: 13 (ref 12–28)
BUN: 15 mg/dL (ref 8–27)
CO2: 27 mmol/L (ref 20–29)
Calcium: 10.6 mg/dL — ABNORMAL HIGH (ref 8.7–10.3)
Chloride: 95 mmol/L — ABNORMAL LOW (ref 96–106)
Creatinine, Ser: 1.13 mg/dL — ABNORMAL HIGH (ref 0.57–1.00)
Glucose: 86 mg/dL (ref 70–99)
Potassium: 4.2 mmol/L (ref 3.5–5.2)
Sodium: 138 mmol/L (ref 134–144)
eGFR: 52 mL/min/{1.73_m2} — ABNORMAL LOW (ref >59)

## 2021-06-19 LAB — MAGNESIUM: Magnesium: 2.1 mg/dL (ref 1.6–2.3)

## 2021-06-23 NOTE — Progress Notes (Signed)
Called pt to inform her about her lab results. Pt understood

## 2021-06-24 ENCOUNTER — Ambulatory Visit: Payer: Medicare Other | Admitting: Cardiology

## 2021-06-24 ENCOUNTER — Other Ambulatory Visit: Payer: Self-pay

## 2021-06-24 ENCOUNTER — Encounter: Payer: Self-pay | Admitting: Cardiology

## 2021-06-24 VITALS — BP 138/82 | HR 84 | Temp 97.9°F | Resp 16 | Ht 66.0 in | Wt 193.6 lb

## 2021-06-24 DIAGNOSIS — R931 Abnormal findings on diagnostic imaging of heart and coronary circulation: Secondary | ICD-10-CM

## 2021-06-24 DIAGNOSIS — I2510 Atherosclerotic heart disease of native coronary artery without angina pectoris: Secondary | ICD-10-CM | POA: Diagnosis not present

## 2021-06-24 DIAGNOSIS — I10 Essential (primary) hypertension: Secondary | ICD-10-CM | POA: Diagnosis not present

## 2021-06-24 DIAGNOSIS — I447 Left bundle-branch block, unspecified: Secondary | ICD-10-CM | POA: Diagnosis not present

## 2021-06-24 MED ORDER — METOPROLOL TARTRATE 25 MG PO TABS: 25.0000 mg | ORAL_TABLET | Freq: Two times a day (BID) | ORAL | 0 refills | Status: DC

## 2021-06-24 NOTE — Progress Notes (Signed)
Date:  06/24/2021   ID:  [name], DOB Jun 29, 1950, MRN [id number]  PCP:  Benito Mccreedy, MD  Cardiologist:  Rex Kras, DO, [facility]  (established care 12/19/2020)  Date: 06/24/21 Last Office Visit: 05/07/2021  Chief Complaint  Patient presents with   Coronary Artery Disease   Hyperlipidemia   Follow-up    HPI  Zoe Cooper is a 71 y.o. female who presents to the office with a chief complaint of " management of nonobstructive CAD." Patient's past medical history and cardiovascular risk factors include: Moderate coronary artery calcification, nonobstructive CAD, hypertension, left bundle branch block, post menopausal female, advance age.   She is referred to the office at the request of Osei-Bonsu, Iona Beard, MD for evaluation of abnormal electrocardiogram.  Patient recently moved from [facility] to [place] and was referred to the office by PCP for evaluation and management of an abnormal EKG.    Since establishing care patient has undergone ischemic evaluation as outlined below including a coronary CTA.  Coronary CTA noted moderate nonobstructive CAD with CT FFR findings to suggest hemodynamically significant stenosis in the first diagonal branch.  Since up titration of anti-anginal therapy she remains asymptomatic.  She presents today for follow-up.  Since last office visit she denies any angina pectoris or heart failure symptoms.  At the last office visit I have transitioned her from Lopressor to Toprol-XL.  Patient did not tolerate Toprol-XL well and is requesting transitioning back to Lopressor.  She has also increased her physical activity to 30 minutes a day 5 days a week and remains asymptomatic.  Patient prefers continuing to uptitrate medical therapy.  She is tolerating the initiation of aspirin 81 mg p.o. daily and statin therapy well without any side effects or intolerances.  She is supposed to have lipids prior to today's office visit; however, still  pending.  FUNCTIONAL STATUS: No structured exercise program or daily routine.   ALLERGIES: No Known Allergies  MEDICATION LIST PRIOR TO VISIT: Current Meds  Medication Sig   aspirin EC 81 MG tablet Take 1 tablet (81 mg total) by mouth daily. Swallow whole.   diphenhydramine-acetaminophen (TYLENOL PM EXTRA STRENGTH) 25-500 MG TABS tablet Take 1 tablet by mouth at bedtime as needed.   hydrochlorothiazide (HYDRODIURIL) 25 MG tablet TAKE 1 TABLET BY MOUTH ONCE DAILY IN THE MORNING   metoprolol tartrate (LOPRESSOR) 25 MG tablet Take 1 tablet (25 mg total) by mouth 2 (two) times daily.   olmesartan (BENICAR) 40 MG tablet Take 1 tablet (40 mg total) by mouth daily at 10 pm.   rosuvastatin (CRESTOR) 20 MG tablet Take 1 tablet (20 mg total) by mouth at bedtime.   [DISCONTINUED] metoprolol succinate (TOPROL-XL) 50 MG 24 hr tablet Take 1 tablet (50 mg total) by mouth every morning. Take with or immediately following a meal.     PAST MEDICAL HISTORY: Past Medical History:  Diagnosis Date   Hypertension    LBBB (left bundle branch block)     PAST SURGICAL HISTORY: Past Surgical History:  Procedure Laterality Date   ABDOMINAL HYSTERECTOMY      FAMILY HISTORY: The patient family history includes Aneurysm in her sister; Cancer in her sister; Heart attack in her father; Lung cancer in her brother.  SOCIAL HISTORY:  The patient  reports that she has quit smoking. Her smoking use included cigarettes. She smoked an average of .25 packs per day. She has never used smokeless tobacco. She reports that she does not currently use alcohol. She reports that she  does not use drugs.  REVIEW OF SYSTEMS: Review of Systems  Constitutional: Negative for chills and fever.  HENT:  Negative for hoarse voice and nosebleeds.   Eyes:  Negative for discharge, double vision and pain.  Cardiovascular:  Negative for chest pain, claudication, dyspnea on exertion, leg swelling, near-syncope, orthopnea, palpitations,  paroxysmal nocturnal dyspnea and syncope.  Respiratory:  Negative for hemoptysis and shortness of breath.   Musculoskeletal:  Negative for muscle cramps and myalgias.  Gastrointestinal:  Negative for abdominal pain, constipation, diarrhea, hematemesis, hematochezia, melena, nausea and vomiting.  Neurological:  Negative for dizziness and light-headedness.   PHYSICAL EXAM: Vitals with BMI 06/24/2021 06/24/2021 05/07/2021  Height - '5\' 6"'  '5\' 6"'   Weight - 193 lbs 10 oz 190 lbs 10 oz  BMI - 96.04 54.09  Systolic 811 914 782  Diastolic 82 956 90  Pulse - 84 88    CONSTITUTIONAL: Well-developed and well-nourished. No acute distress.  SKIN: Skin is warm and dry. No rash noted. No cyanosis. No pallor. No jaundice HEAD: Normocephalic and atraumatic.  EYES: No scleral icterus MOUTH/THROAT: Moist oral membranes.  NECK: No JVD present. No thyromegaly noted. No carotid bruits  LYMPHATIC: No visible cervical adenopathy.  CHEST Normal respiratory effort. No intercostal retractions  LUNGS: Clear to auscultation bilaterally.  No stridor. No wheezes. No rales.  CARDIOVASCULAR: Regular, bradycardia, positive S1-S2, no murmurs rubs or gallops appreciated. ABDOMINAL: Soft, nontender, nondistended, positive bowel sounds all 4 quadrants, no apparent ascites.  EXTREMITIES: No peripheral edema  HEMATOLOGIC: No significant bruising NEUROLOGIC: Oriented to person, place, and time. Nonfocal. Normal muscle tone.  PSYCHIATRIC: Normal mood and affect. Normal behavior. Cooperative No change in physical examination since last visit  CARDIAC DATABASE: EKG: 12/19/2020: Marked sinus bradycardia, 49 bpm, left axis deviation, left bundle branch block.   06/24/2021: NSR, 76 bpm, LAE, left bundle branch block.    Echocardiogram: 03/03/2021:  Hyperdynamic LV systolic function with visual EF >70%. Left ventricle cavity is normal in size. Severe left ventricular hypertrophy. Normal global wall motion. Normal diastolic filling  pattern, normal LAP.  Mild tricuspid regurgitation. No evidence of pulmonary hypertension.  No prior study for comparison.   Stress Testing: Lexiscan/modified Bruce Tetrofosmin stress test 03/03/2021: Lexiscan/modified Bruce nuclear stress test performed using 1-day protocol. Patient walked for 2.2 METS and reached 71% MPHR. No chest pain reported, dyspnea reported.  SPECT images show significant breast tissue attenuation in inferior myocardium , with imaging performed in sitting position. Thus, ischemia in this region cannot be excluded.  Stress LVEF 60%.  Given TID of 1.3, clinical correlation is recommended.   Heart Catheterization: None  CCTA 04/15/2021: 1. Total coronary calcium score of 104. This was 78th percentile for age and sex matched control. 2. Normal coronary origin with right dominance. 3. CAD-RADS = 3. Left Main: Patent. LAD: Mid segment after the first diagonal branch moderate stenosis (50-69%) due to mixed plaque, otherwise patent. D1: Mild stenosis (25-49%) at ostial segment of first diagonal branch. LCX: Patent. OM1: Patent. RCA: Mild stenosis (25-49%) within distal RCA due to non-calcified plaque. 4. Aortic atherosclerosis. 5. Study is sent for CT-FFR. Findings will be performed and reported separately. 6. No acute findings in the imaged extracardiac chest. 7. Esophageal air fluid level suggests dysmotility or gastroesophageal reflux   RECOMMENDATIONS: Consider symptom-guided anti-ischemic pharmacotherapy as well as risk factor modification per guideline directed care. Additional analysis with CT FFR will be submitted.  CT-FFR 04/23/2021: 1. CT FFR analysis showed significant stenosis within the first diagonal branch.  RECOMMENDATIONS: Recommend goal directed and antianginal therapy with aggressive risk factor modification for secondary prevention of coronary artery disease. However, if still symptomatic consider invasive angiography for further evaluation.  Clinical correlation is required  LABORATORY DATA: No flowsheet data found.  CMP Latest Ref Rng & Units 06/18/2021 05/22/2021 04/09/2021  Glucose 70 - 99 mg/dL 86 90 82  BUN 8 - 27 mg/dL '15 14 18  ' Creatinine 0.57 - 1.00 mg/dL 1.13(H) 1.10(H) 1.11(H)  Sodium 134 - 144 mmol/L 138 141 143  Potassium 3.5 - 5.2 mmol/L 4.2 4.2 4.6  Chloride 96 - 106 mmol/L 95(L) 101 97  CO2 20 - 29 mmol/L '27 25 28  ' Calcium 8.7 - 10.3 mg/dL 10.6(H) 10.3 10.6(H)    Lipid Panel  No results found for: CHOL, TRIG, HDL, CHOLHDL, VLDL, LDLCALC, LDLDIRECT, LABVLDL  No components found for: NTPROBNP No results for input(s): PROBNP in the last 8760 hours. No results for input(s): TSH in the last 8760 hours.  BMP Recent Labs    04/09/21 1326 05/22/21 1134 06/18/21 1407  NA 143 141 138  K 4.6 4.2 4.2  CL 97 101 95*  CO2 '28 25 27  ' GLUCOSE 82 90 86  BUN '18 14 15  ' CREATININE 1.11* 1.10* 1.13*  CALCIUM 10.6* 10.3 10.6*    HEMOGLOBIN A1C No results found for: HGBA1C, MPG  External Labs:  Date Collected: 12/09/2020 , information obtained by PCP Potassium: 4.0 Creatinine 0.97 mg/dL. eGFR: 63 mL/min per 1.73 m Hemoglobin: 15.6 g/dL and hematocrit: 47.7 % Lipid profile: Total cholesterol 198 , triglycerides 90 , HDL 61 , LDL 118 AST: 17 , ALT: 8 , alkaline phosphatase: 66  Hemoglobin A1c: 6.3 TSH: 1.12    IMPRESSION:    ICD-10-CM   1. Atherosclerosis of native coronary artery of native heart without angina pectoris  I25.10 EKG 12-Lead    metoprolol tartrate (LOPRESSOR) 25 MG tablet    Lipid Panel With LDL/HDL Ratio    LDL cholesterol, direct    CMP14+EGFR    2. Benign hypertension  I10     3. Elevated coronary artery calcium score  R93.1 Lipid Panel With LDL/HDL Ratio    LDL cholesterol, direct    CMP14+EGFR    4. LBBB (left bundle branch block)  I44.7         RECOMMENDATIONS: Mckynzie Liwanag is a 71 y.o. female whose past medical history and cardiac risk factors include: Hypertension, left  bundle branch block, post menopausal female, advance age.   Atherosclerosis of native coronary artery of native heart without angina pectoris/moderate coronary artery calcification Denies angina pectoris. Overall euvolemic and not in congestive heart failure EKG: Nonischemic Total CAC 104, 70th percentile Coronary CTA: CAD-RADS = 3.  CT FFR notes significant stenosis within the first diagonal branch. CT FFR results patient is noted to have significant stenosis in the first diagonal branch.  However based on coronary CTA the lesion appears to be 25-49% stenosis at the ostial segment.  Since last office visit she has increased her physical activity on a daily basis and continues to be asymptomatic and therefore would like to continue to treat her disease medically.   Continue aspirin and statin therapy. She would like to transition back from Toprol-XL to Lopressor. Increase physical activity as tolerated to goal of 30 minutes a day 5 days a week Educated importance of secondary prevention. Check fasting lipid profile  Benign hypertension Blood pressure within acceptable range. As discussed above we will transition her back to Lopressor. Continue olmesartan  40 mg p.o. every afternoon. Recent labs from 06/18/2021 independently reviewed, renal function remains relatively stable.  Encouraged her to increase her fluid intake by 2 glasses of water.  Her prior echocardiogram noted left ventricular hypertrophy likely due to uncontrolled hypertension however, we have discussed proceeding with cardiac MRI to evaluate for infiltrative cardiomyopathy versus HCM.  For now she would like to hold off on additional testing.    Today's office visit involved management of 2 chronic comorbid conditions, evaluating recent medication initiation and changes, independently reviewed labs from 06/18/2021 and EKG, ordered fasting lipid profile to reevaluate lipids since initiation of statin therapy, and reviewed prior  echocardiogram/stress test/coronary CTA plus FFR reports to help guide medical decision making.  FINAL MEDICATION LIST END OF ENCOUNTER: Meds ordered this encounter  Medications   metoprolol tartrate (LOPRESSOR) 25 MG tablet    Sig: Take 1 tablet (25 mg total) by mouth 2 (two) times daily.    Dispense:  60 tablet    Refill:  0     Medications Discontinued During This Encounter  Medication Reason   metoprolol succinate (TOPROL-XL) 50 MG 24 hr tablet Patient Preference    Current Outpatient Medications:    aspirin EC 81 MG tablet, Take 1 tablet (81 mg total) by mouth daily. Swallow whole., Disp: 30 tablet, Rfl: 11   diphenhydramine-acetaminophen (TYLENOL PM EXTRA STRENGTH) 25-500 MG TABS tablet, Take 1 tablet by mouth at bedtime as needed., Disp: , Rfl:    hydrochlorothiazide (HYDRODIURIL) 25 MG tablet, TAKE 1 TABLET BY MOUTH ONCE DAILY IN THE MORNING, Disp: 90 tablet, Rfl: 0   metoprolol tartrate (LOPRESSOR) 25 MG tablet, Take 1 tablet (25 mg total) by mouth 2 (two) times daily., Disp: 60 tablet, Rfl: 0   olmesartan (BENICAR) 40 MG tablet, Take 1 tablet (40 mg total) by mouth daily at 10 pm., Disp: 90 tablet, Rfl: 0   rosuvastatin (CRESTOR) 20 MG tablet, Take 1 tablet (20 mg total) by mouth at bedtime., Disp: 90 tablet, Rfl: 0  Orders Placed This Encounter  Procedures   Lipid Panel With LDL/HDL Ratio   LDL cholesterol, direct   CMP14+EGFR   EKG 12-Lead    There are no Patient Instructions on file for this visit.   --Continue cardiac medications as reconciled in final medication list. --Return in about 6 months (around 12/22/2021) for Follow up, Coronary artery calcification, Lipid. Or sooner if needed. --Continue follow-up with your primary care physician regarding the management of your other chronic comorbid conditions.  Patient's questions and concerns were addressed to her satisfaction. She voices understanding of the instructions provided during this encounter.   This note  was created using a voice recognition software as a result there may be grammatical errors inadvertently enclosed that do not reflect the nature of this encounter. Every attempt is made to correct such errors.  Rex Kras, [place], [facility]  Pager: [phone] Office: [phone]

## 2021-07-29 ENCOUNTER — Telehealth: Payer: Self-pay

## 2021-07-29 NOTE — Telephone Encounter (Signed)
Pt is having side effects from metoprol states that it is making her depressed and crying a lot. Please call to discuss//ah ?

## 2021-07-30 NOTE — Telephone Encounter (Signed)
Pt is having side effects from metoprol states that it is making her depressed and crying a lot.

## 2021-07-31 NOTE — Telephone Encounter (Signed)
Spoke to her.  ?Lopressor - she was fine with is now causing her to feeling depressed and crying.  ?So she started taking Toprol XL 50mg  po qday and is doing well. If she has any issues she will call back.  ? ?Dr. [place]

## 2021-08-06 ENCOUNTER — Other Ambulatory Visit: Payer: Self-pay

## 2021-08-06 ENCOUNTER — Telehealth: Payer: Self-pay | Admitting: Cardiology

## 2021-08-06 DIAGNOSIS — I2510 Atherosclerotic heart disease of native coronary artery without angina pectoris: Secondary | ICD-10-CM

## 2021-08-06 DIAGNOSIS — I10 Essential (primary) hypertension: Secondary | ICD-10-CM

## 2021-08-06 DIAGNOSIS — R931 Abnormal findings on diagnostic imaging of heart and coronary circulation: Secondary | ICD-10-CM

## 2021-08-06 MED ORDER — ROSUVASTATIN CALCIUM 20 MG PO TABS: 20.0000 mg | ORAL_TABLET | Freq: Every day | ORAL | 3 refills | Status: AC

## 2021-08-06 MED ORDER — OLMESARTAN MEDOXOMIL 40 MG PO TABS: 40.0000 mg | ORAL_TABLET | Freq: Every day | ORAL | 2 refills | Status: AC

## 2021-08-06 NOTE — Telephone Encounter (Signed)
Pt req call for a med refill  ?

## 2021-08-06 NOTE — Telephone Encounter (Signed)
Medications have been refilled ?

## 2021-08-12 ENCOUNTER — Other Ambulatory Visit: Payer: Self-pay

## 2021-08-12 ENCOUNTER — Ambulatory Visit (INDEPENDENT_AMBULATORY_CARE_PROVIDER_SITE_OTHER): Payer: Medicare Other | Admitting: Podiatry

## 2021-08-12 ENCOUNTER — Encounter: Payer: Self-pay | Admitting: Podiatry

## 2021-08-12 VITALS — BP 143/105 | HR 81 | Temp 99.0°F

## 2021-08-12 DIAGNOSIS — M79674 Pain in right toe(s): Secondary | ICD-10-CM

## 2021-08-12 DIAGNOSIS — L819 Disorder of pigmentation, unspecified: Secondary | ICD-10-CM | POA: Diagnosis not present

## 2021-08-12 DIAGNOSIS — B351 Tinea unguium: Secondary | ICD-10-CM | POA: Diagnosis not present

## 2021-08-12 DIAGNOSIS — M79675 Pain in left toe(s): Secondary | ICD-10-CM

## 2021-08-12 DIAGNOSIS — M2012 Hallux valgus (acquired), left foot: Secondary | ICD-10-CM

## 2021-08-12 DIAGNOSIS — R7303 Prediabetes: Secondary | ICD-10-CM

## 2021-08-12 DIAGNOSIS — M2011 Hallux valgus (acquired), right foot: Secondary | ICD-10-CM | POA: Diagnosis not present

## 2021-08-12 DIAGNOSIS — I10 Essential (primary) hypertension: Secondary | ICD-10-CM | POA: Insufficient documentation

## 2021-08-12 DIAGNOSIS — D751 Secondary polycythemia: Secondary | ICD-10-CM | POA: Diagnosis not present

## 2021-08-12 DIAGNOSIS — Z6830 Body mass index (BMI) 30.0-30.9, adult: Secondary | ICD-10-CM | POA: Insufficient documentation

## 2021-08-12 DIAGNOSIS — Q828 Other specified congenital malformations of skin: Secondary | ICD-10-CM | POA: Diagnosis not present

## 2021-08-12 DIAGNOSIS — E782 Mixed hyperlipidemia: Secondary | ICD-10-CM | POA: Insufficient documentation

## 2021-08-12 NOTE — Progress Notes (Signed)
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 Patient has tried self attempt at trimming toenail. Patient also has painful calluses plantar aspect of both feet and corns b/l 5th digits which are tender when wearing enclosed shoe gear. Plantar calluses are painful weightbearing with and without shoe gear.  ? ?Patient has lesion on plantarmedial aspect of her right heel. She states she had area biopsied about 8 years ago by Dr. Lucianne Muss in [place], [place]. Patient relates biopsy results were negative at that time. She relates no pain when weightbearing, denies any drainage. She states area may have been smaller 8 years ago.She has a sister with h/o breast cancer and brother with h/o lung cancer. ? ?Patient states she has not taken her blood pressure medication nor eaten this morning. ? ? ? ? ? ?Past Medical History:  ?Diagnosis Date  ? Hypertension   ? LBBB (left bundle branch block)   ?  ? ?Patient Active Problem List  ? Diagnosis Date Noted  ? Hypertension 08/12/2021  ? Mixed hyperlipidemia 08/12/2021  ? Body mass index (BMI) 30.0-30.9, adult 08/12/2021  ? Polycythemia 08/12/2021  ? Prediabetes 08/12/2021  ? Abnormal nuclear stress test   ? LBBB (left bundle branch block)   ?  ? ?Past Surgical History:  ?Procedure Laterality Date  ? ABDOMINAL HYSTERECTOMY    ?  ? ?Current Outpatient Medications on File Prior to Visit  ?Medication Sig Dispense Refill  ? aspirin EC 81 MG tablet Take 1 tablet (81 mg total) by mouth daily. Swallow whole. 30 tablet 11  ? diphenhydramine-acetaminophen (TYLENOL PM EXTRA STRENGTH) 25-500 MG TABS tablet Take 1 tablet by mouth at bedtime as needed.    ? hydrochlorothiazide (HYDRODIURIL) 25 MG tablet TAKE 1 TABLET BY MOUTH ONCE DAILY IN THE MORNING 90 tablet 0  ? metoprolol tartrate (LOPRESSOR) 25 MG tablet Take 1 tablet (25 mg total) by mouth 2 (two) times daily. 60 tablet  0  ? olmesartan (BENICAR) 40 MG tablet Take 1 tablet (40 mg total) by mouth daily at 10 pm. 90 tablet 2  ? rosuvastatin (CRESTOR) 20 MG tablet Take 1 tablet (20 mg total) by mouth at bedtime. 90 tablet 3  ? ?No current facility-administered medications on file prior to visit.  ?  ? ?No Known Allergies  ? ?Social History  ? ?Occupational History  ? Not on file  ?Tobacco Use  ? Smoking status: Former  ?  Packs/day: 0.25  ?  Types: Cigarettes  ? Smokeless tobacco: Never  ?Vaping Use  ? Vaping Use: Never used  ?Substance and Sexual Activity  ? Alcohol use: Not Currently  ? Drug use: Never  ? Sexual activity: Not on file  ?  ? ?Family History  ?Problem Relation Age of Onset  ? Heart attack Father   ? Cancer Sister   ? Aneurysm Sister   ? Lung cancer Brother   ?  ? ? ?There is no immunization history on file for this patient.  ? ?Objective: ?Kaelei Wheeler is a pleasant 71 y.o. female WD, WN in NAD. AAO x 3. ? ?Vitals:  ? 08/12/21 1141  ?BP: (!) 143/105  ?Pulse: 81  ?Temp: 99 ?F (37.2 ?C)  ? ? ?Vascular Examination:  ?CFT <3 seconds b/l LE. Palpable DP pulse(s) b/l LE. Palpable PT pulse(s) b/l LE. Pedal hair sparse. No pain with calf compression b/l. Lower extremity skin temperature gradient within normal limits. No  edema noted b/l LE. No ischemia or gangrene noted b/l LE. No cyanosis or clubbing noted b/l LE. ? ?Dermatological Examination: ? ? ? ? ?No open wounds b/l LE. No interdigital macerations noted b/l LE. Toenails 1-5 b/l elongated, discolored, dystrophic, thickened, crumbly with subungual debris and tenderness to dorsal palpation. Porokeratotic lesion(s) right heel, bilateral 5th toes, and L hallux. No erythema, no edema, no drainage, no fluctuance. Suspicious lesion noted medial aspect right heel. Characteristics of lesion: asymmetry, irregular borders, variegated color, possesses subcutaneous nodularity, and measures 1.5 x 1.5 cm.. ? ?Musculoskeletal: ?Muscle strength 5/5 to all lower extremity muscle groups  bilaterally. HAV with bunion deformity noted b/l LE. Hammertoe deformity noted 2-5 b/l. Patient ambulates independent of any assistive aids. ? ?Neurological: ?Protective sensation intact 5/5 intact bilaterally with 10g monofilament b/l. Vibratory sensation intact b/l. Deep tendon reflexes normal b/l.  ? ?Assessment: ?1. Pain due to onychomycosis of toenails of both feet   ?2. Porokeratosis   ?3. Polycythemia   ?4. Hallux valgus, acquired, bilateral   ?5. Pigmented skin lesion suspicious for malignant neoplasm   ?6. Prediabetes   ?  ?Plan: ?-Patient was evaluated and treated. All patient's and/or POA's questions/concerns answered on today's visit. ?-Toenails 1-5 b/l were debrided in length and girth with sterile nail nippers and dremel without iatrogenic bleeding.  ?-Painful porokeratotic lesion(s) right heel, bilateral 5th toes, and L hallux pared and enucleated with sterile scalpel blade and dermal currette. Total number of lesions debrided=4. Light bleeding of heel porokeratosis addressed with Lumicain Hemostatic Solution. TAO and band-aid applied. She is to apply Neosporin to area once daily for 3 days. ?-Discussed suspicious lesion right foot. Although she relates negative biopsy 8 years ago, lesion is highly suspicious of melanoma. She is apprehensive to have another biopsy done due to pain and inability to weight bear after procedure. Encouraged her to have this done asap. She agreed. ?-Patient referred to Dr. Sharl Ma for evaluation of highly suspicious lesion right foot and need for biopsy. ?-Patient/POA to call should there be question/concern in the interim. ? ?Return in about 3 months (around 11/12/2021). ? ?Freddie Breech, DPM ?

## 2021-08-12 NOTE — Patient Instructions (Signed)
Skin Biopsy A skin biopsy is a procedure to remove a sample of your skin so that it can be checked for any disease. You may need a skin biopsy if you have a skin disease or abnormal changes on your skin (lesion). Tell a health care provider about: Any allergies you have. All medicines you are taking, including vitamins, herbs, eye drops, creams, and over-the-counter medicines. Any problems you or family members have had with anesthetic medicines. Any bleeding problems you have. Any surgeries you have had. Any medical conditions you have. Whether you are pregnant or may be pregnant. What are the risks? Generally, this is a safe procedure. However, problems may occur, including: Bleeding. Infection. Scarring. Allergic reaction to anesthetics, surgical materials, or ointments. What happens before the procedure? Medicines Ask your health care provider about: Changing or stopping your regular medicines. This is especially important if you are taking diabetes medicines or blood thinners. Taking medicines such as aspirin and ibuprofen. These medicines can thin your blood. Do not take these medicines unless your health care provider tells you to take them. Taking over-the-counter medicines, vitamins, herbs, and supplements. General instructions Follow instructions from your health care provider about eating or drinking restrictions. Ask your health care provider: How your surgery site will be marked. What steps will be taken to help prevent infection. These steps may include: Removing hair at the surgery site. Washing skin with a germ-killing soap. Taking antibiotic medicine. Ask your health care provider if you will need someone to take you home from the hospital or clinic after the procedure. What happens during the procedure?  You may be given medicine to numb the area (local anesthetic). Your health care provider will take a sample using one of these steps, depending on the type of skin  problem that you have: Shave biopsy. Your health care provider will shave away layers of your skin lesion with a sharp blade. After shaving, a gel or ointment may be used to control bleeding. Punch biopsy. Your health care provider will use a tool to remove all or part of the lesion. This leaves a small hole about the width of a pencil eraser. The area may be covered with a gel or ointment. Excisional or incisional biopsy. Your health care provider will use a surgical blade to remove all or part of your lesion. Your skin biopsy site may be closed with stitches (sutures). A bandage (dressing) will be applied. The procedure may vary among health care providers and hospitals. What happens after the procedure? Your skin sample will be sent to a lab for tests. Your skin biopsy site will be watched to make sure that it stops bleeding. You will be given instructions on how to care for your biopsy site. It is up to you to get the results of your procedure. Ask your health care provider, or the department that is doing the procedure, when your results will be ready. Summary A skin biopsy is a procedure to remove a sample of your skin (lesion) so that it can be checked under a microscope. Tell a health care provider about your medical history and all medicines you are taking, including vitamins, herbs, eye drops, creams, and over-the-counter medicines. Before the procedure, ask your health care provider about changing or stopping your regular medicines. During the procedure, your health care provider will take a skin sample from the area where you have the skin problem. After the procedure, your skin sample will be sent to a laboratory for testing. This   information is not intended to replace advice given to you by your health care provider. Make sure you discuss any questions you have with your health care provider. Document Revised: 12/03/2020 Document Reviewed: 12/03/2020 Elsevier Patient Education   2022 Elsevier Inc.  

## 2021-08-19 ENCOUNTER — Ambulatory Visit: Payer: Medicare Other | Admitting: Podiatry

## 2021-08-25 ENCOUNTER — Ambulatory Visit: Payer: Medicare Other | Admitting: Podiatry

## 2021-08-26 ENCOUNTER — Encounter: Payer: Self-pay | Admitting: Gastroenterology

## 2021-09-10 ENCOUNTER — Other Ambulatory Visit: Payer: Self-pay | Admitting: Internal Medicine

## 2021-09-11 ENCOUNTER — Other Ambulatory Visit: Payer: Medicare Other

## 2021-09-16 ENCOUNTER — Other Ambulatory Visit: Payer: Self-pay

## 2021-09-18 ENCOUNTER — Other Ambulatory Visit: Payer: Self-pay

## 2021-09-18 DIAGNOSIS — I2510 Atherosclerotic heart disease of native coronary artery without angina pectoris: Secondary | ICD-10-CM

## 2021-09-18 DIAGNOSIS — I10 Essential (primary) hypertension: Secondary | ICD-10-CM

## 2021-09-18 MED ORDER — HYDROCHLOROTHIAZIDE 25 MG PO TABS: 25.0000 mg | ORAL_TABLET | Freq: Every morning | ORAL | 0 refills | Status: DC

## 2021-09-18 MED ORDER — METOPROLOL TARTRATE 25 MG PO TABS: 25.0000 mg | ORAL_TABLET | Freq: Two times a day (BID) | ORAL | 0 refills | Status: DC

## 2021-10-06 DIAGNOSIS — R7303 Prediabetes: Secondary | ICD-10-CM | POA: Diagnosis not present

## 2021-10-06 DIAGNOSIS — Z0001 Encounter for general adult medical examination with abnormal findings: Secondary | ICD-10-CM | POA: Diagnosis not present

## 2021-10-06 DIAGNOSIS — D751 Secondary polycythemia: Secondary | ICD-10-CM | POA: Diagnosis not present

## 2021-10-06 DIAGNOSIS — I10 Essential (primary) hypertension: Secondary | ICD-10-CM | POA: Diagnosis not present

## 2021-10-06 DIAGNOSIS — E782 Mixed hyperlipidemia: Secondary | ICD-10-CM | POA: Diagnosis not present

## 2021-10-27 DIAGNOSIS — R931 Abnormal findings on diagnostic imaging of heart and coronary circulation: Secondary | ICD-10-CM | POA: Diagnosis not present

## 2021-10-27 DIAGNOSIS — E782 Mixed hyperlipidemia: Secondary | ICD-10-CM | POA: Diagnosis not present

## 2021-10-27 DIAGNOSIS — R7303 Prediabetes: Secondary | ICD-10-CM | POA: Diagnosis not present

## 2021-10-27 DIAGNOSIS — I10 Essential (primary) hypertension: Secondary | ICD-10-CM | POA: Diagnosis not present

## 2021-10-27 DIAGNOSIS — D751 Secondary polycythemia: Secondary | ICD-10-CM | POA: Diagnosis not present

## 2021-10-27 DIAGNOSIS — I2510 Atherosclerotic heart disease of native coronary artery without angina pectoris: Secondary | ICD-10-CM | POA: Diagnosis not present

## 2021-10-28 LAB — CMP14+EGFR
ALT: 7 IU/L (ref 0–32)
AST: 18 IU/L (ref 0–40)
Albumin/Globulin Ratio: 1.5 (ref 1.2–2.2)
Albumin: 4.8 g/dL — ABNORMAL HIGH (ref 3.7–4.7)
Alkaline Phosphatase: 63 IU/L (ref 44–121)
BUN/Creatinine Ratio: 28 (ref 12–28)
BUN: 35 mg/dL — ABNORMAL HIGH (ref 8–27)
Bilirubin Total: 0.5 mg/dL (ref 0.0–1.2)
CO2: 24 mmol/L (ref 20–29)
Calcium: 10.5 mg/dL — ABNORMAL HIGH (ref 8.7–10.3)
Chloride: 101 mmol/L (ref 96–106)
Creatinine, Ser: 1.26 mg/dL — ABNORMAL HIGH (ref 0.57–1.00)
Globulin, Total: 3.2 g/dL (ref 1.5–4.5)
Glucose: 122 mg/dL — ABNORMAL HIGH (ref 70–99)
Potassium: 4.4 mmol/L (ref 3.5–5.2)
Sodium: 141 mmol/L (ref 134–144)
Total Protein: 8 g/dL (ref 6.0–8.5)
eGFR: 46 mL/min/{1.73_m2} — ABNORMAL LOW (ref >59)

## 2021-10-28 LAB — LIPID PANEL WITH LDL/HDL RATIO
Cholesterol, Total: 133 mg/dL (ref 100–199)
HDL: 54 mg/dL (ref >39)
LDL Chol Calc (NIH): 55 mg/dL (ref 0–99)
LDL/HDL Ratio: 1 ratio (ref 0.0–3.2)
Triglycerides: 136 mg/dL (ref 0–149)
VLDL Cholesterol Cal: 24 mg/dL (ref 5–40)

## 2021-10-28 LAB — LDL CHOLESTEROL, DIRECT: LDL Direct: 54 mg/dL (ref 0–99)

## 2021-11-13 NOTE — Progress Notes (Signed)
Called and spoke to patient's she voiced understanding labs were forward to PCP

## 2021-11-19 ENCOUNTER — Encounter: Payer: Self-pay | Admitting: Podiatry

## 2021-11-19 ENCOUNTER — Ambulatory Visit (INDEPENDENT_AMBULATORY_CARE_PROVIDER_SITE_OTHER): Payer: Medicare Other | Admitting: Podiatry

## 2021-11-19 DIAGNOSIS — M79675 Pain in left toe(s): Secondary | ICD-10-CM

## 2021-11-19 DIAGNOSIS — D751 Secondary polycythemia: Secondary | ICD-10-CM | POA: Diagnosis not present

## 2021-11-19 DIAGNOSIS — Q828 Other specified congenital malformations of skin: Secondary | ICD-10-CM | POA: Diagnosis not present

## 2021-11-19 DIAGNOSIS — B351 Tinea unguium: Secondary | ICD-10-CM | POA: Diagnosis not present

## 2021-11-19 DIAGNOSIS — L819 Disorder of pigmentation, unspecified: Secondary | ICD-10-CM

## 2021-11-19 DIAGNOSIS — M79674 Pain in right toe(s): Secondary | ICD-10-CM

## 2021-11-19 NOTE — Progress Notes (Signed)
Subjective:  Patient ID: Zoe Cooper, female    DOB: November 26, 1950,  MRN: [id number]  Zoe Cooper presents to clinic today for at risk foot care. Patient has history of polycythemia and painful porokeratotic lesion(s) b/l lower extremities and painful mycotic toenails that limit ambulation. Painful toenails interfere with ambulation. Aggravating factors include wearing enclosed shoe gear. Pain is relieved with periodic professional debridement. Painful porokeratotic lesions are aggravated when weightbearing with and without shoegear. Pain is relieved with periodic professional debridement.  Patient states blood glucose runs between 124-135 mg/dl daily.    New problem(s): None.   PCP is Osei-Bonsu, Greggory Stallion, MD , and last visit was  November 13, 2021  No Known Allergies  Review of Systems: Negative except as noted in the HPI.  Objective: No changes noted in today's physical examination. Vascular Examination:  CFT <3 seconds b/l LE. Palpable DP Cooper(s) b/l LE. Palpable PT Cooper(s) b/l LE. Pedal hair sparse. No pain with calf compression b/l. Lower extremity skin temperature gradient within normal limits. No edema noted b/l LE. No ischemia or gangrene noted b/l LE. No cyanosis or clubbing noted b/l LE.  Dermatological Examination: Picture from 08/12/2021:  Picture from 11/19/2021:   Picture from 08/12/2021:   Picture from 11/19/2021:  No open wounds b/l LE. No interdigital macerations noted b/l LE. Toenails 1-5 b/l elongated, discolored, dystrophic, thickened, crumbly with subungual debris and tenderness to dorsal palpation. Porokeratotic lesion(s) right heel, bilateral 5th toes, and L hallux. No erythema, no edema, no drainage, no fluctuance. Suspicious lesion noted medial aspect right heel. Characteristics of lesion: asymmetry, irregular borders, variegated color, possesses subcutaneous nodularity, and measurements are unchanged 1.5 x 1.5 cm.  Musculoskeletal: Muscle strength 5/5 to all  lower extremity muscle groups bilaterally. HAV with bunion deformity noted b/l LE. Hammertoe deformity noted 2-5 b/l. Patient ambulates independent of any assistive aids.  Neurological: Protective sensation intact 5/5 intact bilaterally with 10g monofilament b/l. Vibratory sensation intact b/l. Deep tendon reflexes normal b/l.   Assessment/Plan: 1. Pain due to onychomycosis of toenails of both feet   2. Pigmented skin lesion suspicious for malignant neoplasm   3. Polycythemia     -Patient was evaluated and treated. All patient's and/or POA's questions/concerns answered on today's visit. -I had a long discussion with Zoe Cooper regarding need for biopsy of right foot which would be performed by Dr. Lilian Kapur. Zoe Cooper concern is who will take care of her sister, Zoe Cooper. They have other sisters. I posed a question to her of who would take care of Zoe Cooper if something happened to her. I have asked her to include her other family in her decision making as I'm sure they would be willing to help if they knew Zoe Cooper needed to see about her health. She related understanding. She had questions regarding the biopsy: How long will she be down? Dr. Lilian Kapur states she may have 1-2 stitches and should be able to walk after her biopsy. Patient then states she may want to have the entire lesion removed. I explained that would warrant an entirely different plan/postoperative course. I have asked her to follow up with Dr. Lilian Kapur and to bring her family with her at this appointment. She agreed and has scheduled an appointment with Dr. Lilian Kapur, but she has done this in the past and canceled. I spent over 30 minutes reviewing chart, examining patient, discussing with Dr. Lilian Kapur and counseling patient. -Mycotic toenails 1-5 bilaterally were debrided in length and girth with sterile nail nippers and dremel  without incident. -Porokeratotic lesion(s) right heel, bilateral 5th toes, and left great toe pared and  enucleated with sterile scalpel blade without incident. Total number of lesions debrided=4. -Patient/POA to call should there be question/concern in the interim.   Return in about 3 months (around 02/19/2022).  Freddie Breech, DPM

## 2021-12-01 ENCOUNTER — Ambulatory Visit (INDEPENDENT_AMBULATORY_CARE_PROVIDER_SITE_OTHER): Payer: Medicare Other | Admitting: Podiatry

## 2021-12-01 ENCOUNTER — Other Ambulatory Visit: Payer: Self-pay

## 2021-12-01 DIAGNOSIS — L819 Disorder of pigmentation, unspecified: Secondary | ICD-10-CM

## 2021-12-01 DIAGNOSIS — I2510 Atherosclerotic heart disease of native coronary artery without angina pectoris: Secondary | ICD-10-CM

## 2021-12-01 NOTE — Progress Notes (Signed)
Called and spoke to patient she stated PCP stop her HCTZ and metoprolol and was started on atenolol-chlorthalidone 50- 25 mg

## 2021-12-02 ENCOUNTER — Telehealth: Payer: Self-pay | Admitting: Cardiology

## 2021-12-02 NOTE — Telephone Encounter (Signed)
Patient says she got a call, she thinks someone was trying to go over her medication list or tell her about any changes. Can call her back at number provided.

## 2021-12-02 NOTE — Progress Notes (Signed)
  Subjective:  Patient ID: Zoe Cooper, female    DOB: June 24, 1950,  MRN: [id number]  Chief Complaint  Patient presents with   lesion    suspicious lesion rt foot poss bx-urgent wkin per Dr Galaway/time per Scarlette Slice    71 y.o. female presents with the above complaint. History confirmed with patient.  She presents today to be evaluated for a pigmented skin lesion, she was referred to me by Dr. Eloy End.  She says that her doctor biopsied it over 10 years ago and at the time it was negative.  She says that she does not think it has changed much since that time  Objective:  Physical Exam: warm, good capillary refill, no trophic changes or ulcerative lesions, normal DP and PT pulses, normal sensory exam, and large pigmented suspicious appearing skin lesion on the medial heel.          Assessment:   1. Pigmented skin lesion suspicious for malignant neoplasm      Plan:  Patient was evaluated and treated and all questions answered.  We reviewed possible etiologies of the skin lesion.  I discussed with her that due to its appearance I am suspicious that this could be acral melanoma.  She feels like that is not likely, and says that since it was biopsied previously and was okay that she does not think it has changed much since then.  I discussed with her that given the time period between biopsies over 10 years I still recommend a repeat biopsy at this point due to the possibility of transformation.  We discussed the risk and benefits of the biopsy.  She would like to wait until she is able to make sure that her sister is cared for as she is her primary guardian and caretaker.  We will schedule her biopsy for a future morning office procedure in August.  I will see her for the procedure that day.  No follow-ups on file.

## 2021-12-08 NOTE — Progress Notes (Signed)
Ok done

## 2021-12-19 NOTE — Telephone Encounter (Signed)
Called patient, NA, LMAM

## 2021-12-22 ENCOUNTER — Ambulatory Visit: Payer: Medicare Other | Admitting: Cardiology

## 2021-12-23 ENCOUNTER — Ambulatory Visit: Payer: Medicare Other | Admitting: Cardiology

## 2022-01-05 ENCOUNTER — Telehealth: Payer: Self-pay

## 2022-01-05 NOTE — Telephone Encounter (Signed)
DOS 01/06/2022  [employer]   [facility] BIOPSY - [id number]  Notification or Prior Authorization is not required for the requested services  Decision ID #:[id number]

## 2022-01-06 ENCOUNTER — Telehealth: Payer: Self-pay | Admitting: Podiatry

## 2022-01-06 ENCOUNTER — Ambulatory Visit (INDEPENDENT_AMBULATORY_CARE_PROVIDER_SITE_OTHER): Payer: Medicare Other | Admitting: Podiatry

## 2022-01-06 DIAGNOSIS — Z91199 Patient's noncompliance with other medical treatment and regimen due to unspecified reason: Secondary | ICD-10-CM

## 2022-01-06 NOTE — Addendum Note (Signed)
Addended byLilian Kapur, Sorrel Cassetta R on: 01/06/2022 08:40 AM   Modules accepted: Level of Service

## 2022-01-06 NOTE — Telephone Encounter (Signed)
Left message on patient vm to follow up on appt today at 730a.

## 2022-01-06 NOTE — Progress Notes (Addendum)
Patient was not picked up by transportation. Rescheduled to 9/5 at 745AM

## 2022-01-07 DIAGNOSIS — D751 Secondary polycythemia: Secondary | ICD-10-CM | POA: Diagnosis not present

## 2022-01-07 DIAGNOSIS — I10 Essential (primary) hypertension: Secondary | ICD-10-CM | POA: Diagnosis not present

## 2022-01-07 DIAGNOSIS — R7303 Prediabetes: Secondary | ICD-10-CM | POA: Diagnosis not present

## 2022-01-07 DIAGNOSIS — E782 Mixed hyperlipidemia: Secondary | ICD-10-CM | POA: Diagnosis not present

## 2022-01-12 ENCOUNTER — Ambulatory Visit: Payer: Medicare Other | Admitting: Cardiology

## 2022-01-12 ENCOUNTER — Encounter: Payer: Self-pay | Admitting: Cardiology

## 2022-01-12 VITALS — BP 138/79 | HR 55 | Resp 16 | Ht 66.0 in | Wt 198.0 lb

## 2022-01-12 DIAGNOSIS — I10 Essential (primary) hypertension: Secondary | ICD-10-CM

## 2022-01-12 DIAGNOSIS — I2510 Atherosclerotic heart disease of native coronary artery without angina pectoris: Secondary | ICD-10-CM

## 2022-01-12 DIAGNOSIS — N1832 Chronic kidney disease, stage 3b: Secondary | ICD-10-CM | POA: Diagnosis not present

## 2022-01-12 DIAGNOSIS — I447 Left bundle-branch block, unspecified: Secondary | ICD-10-CM | POA: Diagnosis not present

## 2022-01-12 DIAGNOSIS — E1165 Type 2 diabetes mellitus with hyperglycemia: Secondary | ICD-10-CM | POA: Diagnosis not present

## 2022-01-12 DIAGNOSIS — E782 Mixed hyperlipidemia: Secondary | ICD-10-CM | POA: Diagnosis not present

## 2022-01-12 DIAGNOSIS — R931 Abnormal findings on diagnostic imaging of heart and coronary circulation: Secondary | ICD-10-CM

## 2022-01-12 DIAGNOSIS — D751 Secondary polycythemia: Secondary | ICD-10-CM | POA: Diagnosis not present

## 2022-01-12 NOTE — Progress Notes (Signed)
Date:  01/12/2022   ID:  [name], DOB March 31, 1951, MRN [id number]  PCP:  Benito Mccreedy, MD  Cardiologist:  Rex Kras, DO, [facility]  (established care 12/19/2020)  Date: 01/12/22 Last Office Visit: 06/24/2021  Chief Complaint  Patient presents with   Coronary artery calcification   Hyperlipidemia   Follow-up    HPI  Zoe Cooper is a 71 y.o. female whose past medical history and cardiovascular risk factors include: Moderate coronary artery calcification, nonobstructive CAD, NIDDM, hypertension, left bundle branch block, post menopausal female, advance age.   Prior work-up is illustrated moderate nonobstructive CAD and CT FFR results noted hemodynamically significant stenosis in the first diagonal branch.  However, based on CCTA she was noted to have <50% stenosis in first diagonal branch.  Despite up titration of physical activity on a regular basis patient continued to be asymptomatic from a clinical standpoint and the shared decision was to treat her diagonal disease medically.  She now presents for 11-monthfollow-up visit.  Since last office visit she is doing well from a cardiovascular standpoint.  No hospitalizations or urgent care visits for cardiovascular reasons.  She is ambulating at least 30 minutes a day 5 days a week and tries to do more if time permitting.  She does not have exertional chest pain or heart failure symptoms.  Patient states that she was recently diagnosed with diabetes and has been started on metformin.  She still has to pick up the prescription and initiate therapy.  Currently being managed by PCP.  FUNCTIONAL STATUS: No structured exercise program or daily routine.   ALLERGIES: No Known Allergies  MEDICATION LIST PRIOR TO VISIT: Current Meds  Medication Sig   aspirin EC 81 MG tablet Take 1 tablet (81 mg total) by mouth daily. Swallow whole.   atenolol-chlorthalidone (TENORETIC) 50-25 MG tablet Take 1 tablet by mouth daily.    diphenhydramine-acetaminophen (TYLENOL PM EXTRA STRENGTH) 25-500 MG TABS tablet Take 1 tablet by mouth at bedtime as needed.   olmesartan (BENICAR) 40 MG tablet Take 1 tablet (40 mg total) by mouth daily at 10 pm.   rosuvastatin (CRESTOR) 20 MG tablet Take 1 tablet (20 mg total) by mouth at bedtime.     PAST MEDICAL HISTORY: Past Medical History:  Diagnosis Date   Coronary atherosclerosis due to calcified coronary lesion    Diabetes mellitus without complication (HCC)    Hyperlipidemia    Hypertension    LBBB (left bundle branch block)     PAST SURGICAL HISTORY: Past Surgical History:  Procedure Laterality Date   ABDOMINAL HYSTERECTOMY      FAMILY HISTORY: The patient family history includes Aneurysm in her sister; Cancer in her sister; Heart attack in her father; Lung cancer in her brother.  SOCIAL HISTORY:  The patient  reports that she has quit smoking. Her smoking use included cigarettes. She smoked an average of .25 packs per day. She has never used smokeless tobacco. She reports that she does not currently use alcohol. She reports that she does not use drugs.  REVIEW OF SYSTEMS: Review of Systems  Constitutional: Negative for chills and fever.  HENT:  Negative for hoarse voice and nosebleeds.   Eyes:  Negative for discharge, double vision and pain.  Cardiovascular:  Negative for chest pain, claudication, dyspnea on exertion, leg swelling, near-syncope, orthopnea, palpitations, paroxysmal nocturnal dyspnea and syncope.  Respiratory:  Negative for hemoptysis and shortness of breath.   Musculoskeletal:  Negative for muscle cramps and myalgias.  Gastrointestinal:  Negative  for abdominal pain, constipation, diarrhea, hematemesis, hematochezia, melena, nausea and vomiting.  Neurological:  Negative for dizziness and light-headedness.    PHYSICAL EXAM:    01/12/2022    2:42 PM 08/12/2021   11:41 AM 06/24/2021    1:52 PM  Vitals with BMI  Height _0     Weight 198 lbs    BMI  97.41    Systolic 638 453 646  Diastolic 79 803 82  Pulse 55 81    Physical Exam  Constitutional: No distress.  Age appropriate, hemodynamically stable.   Neck: No JVD present.  Cardiovascular: Normal rate, regular rhythm, S1 normal, S2 normal, intact distal pulses and normal pulses. Exam reveals no gallop, no S3 and no S4.  No murmur heard. Pulmonary/Chest: Effort normal and breath sounds normal. No stridor. She has no wheezes. She has no rales.  Abdominal: Soft. Bowel sounds are normal. She exhibits no distension. There is no abdominal tenderness.  Musculoskeletal:        General: No edema.     Cervical back: Neck supple.  Neurological: She is alert and oriented to person, place, and time. She has intact cranial nerves (2-12).  Skin: Skin is warm and moist.   CARDIAC DATABASE: EKG: 01/12/22: Sinus bradycardia, 54 bpm, first-degree AV block, left axis, left bundle branch block.  Echocardiogram: 03/03/2021:  Hyperdynamic LV systolic function with visual EF >70%. Left ventricle cavity is normal in size. Severe left ventricular hypertrophy. Normal global wall motion. Normal diastolic filling pattern, normal LAP.  Mild tricuspid regurgitation. No evidence of pulmonary hypertension.  No prior study for comparison.   Stress Testing: Lexiscan/modified Bruce Tetrofosmin stress test 03/03/2021: Lexiscan/modified Bruce nuclear stress test performed using 1-day protocol. Patient walked for 2.2 METS and reached 71% MPHR. No chest pain reported, dyspnea reported.  SPECT images show significant breast tissue attenuation in inferior myocardium , with imaging performed in sitting position. Thus, ischemia in this region cannot be excluded.  Stress LVEF 60%.  Given TID of 1.3, clinical correlation is recommended.   Heart Catheterization: None  CCTA 04/15/2021: 1. Total coronary calcium score of 104. This was 78th percentile for age and sex matched control. 2. Normal coronary origin with right  dominance. 3. CAD-RADS = 3. Left Main: Patent. LAD: Mid segment after the first diagonal branch moderate stenosis (50-69%) due to mixed plaque, otherwise patent. D1: Mild stenosis (25-49%) at ostial segment of first diagonal branch. LCX: Patent. OM1: Patent. RCA: Mild stenosis (25-49%) within distal RCA due to non-calcified plaque. 4. Aortic atherosclerosis. 5. Study is sent for CT-FFR. Findings will be performed and reported separately. 6. No acute findings in the imaged extracardiac chest. 7. Esophageal air fluid level suggests dysmotility or gastroesophageal reflux   RECOMMENDATIONS: Consider symptom-guided anti-ischemic pharmacotherapy as well as risk factor modification per guideline directed care. Additional analysis with CT FFR will be submitted.  CT-FFR 04/23/2021: 1. CT FFR analysis showed significant stenosis within the first diagonal branch.   RECOMMENDATIONS: Recommend goal directed and antianginal therapy with aggressive risk factor modification for secondary prevention of coronary artery disease. However, if still symptomatic consider invasive angiography for further evaluation. Clinical correlation is required  LABORATORY DATA:     No data to display             Latest Ref Rng & Units 10/27/2021   11:37 AM 06/18/2021    2:07 PM 05/22/2021   11:34 AM  CMP  Glucose 70 - 99 mg/dL 122  86  90   BUN  8 - 27 mg/dL 35  15  14   Creatinine 0.57 - 1.00 mg/dL 1.26  1.13  1.10   Sodium 134 - 144 mmol/L 141  138  141   Potassium 3.5 - 5.2 mmol/L 4.4  4.2  4.2   Chloride 96 - 106 mmol/L 101  95  101   CO2 20 - 29 mmol/L _0 Calcium 8.7 - 10.3 mg/dL 10.5  10.6  10.3   Total Protein 6.0 - 8.5 g/dL 8.0     Total Bilirubin 0.0 - 1.2 mg/dL 0.5     Alkaline Phos 44 - 121 IU/L 63     AST 0 - 40 IU/L 18     ALT 0 - 32 IU/L 7       Lipid Panel     Component Value Date/Time   CHOL 133 10/27/2021 1137   TRIG 136 10/27/2021 1137   HDL 54 10/27/2021 1137   LDLCALC  55 10/27/2021 1137   LDLDIRECT 54 10/27/2021 1137   LABVLDL 24 10/27/2021 1137    No components found for: "NTPROBNP" No results for input(s): "PROBNP" in the last 8760 hours. No results for input(s): "TSH" in the last 8760 hours.  BMP Recent Labs    05/22/21 1134 06/18/21 1407 10/27/21 1137  NA 141 138 141  K 4.2 4.2 4.4  CL 101 95* 101  CO2 _1 GLUCOSE 90 86 122*  BUN 14 15 35*  CREATININE 1.10* 1.13* 1.26*  CALCIUM 10.3 10.6* 10.5*    HEMOGLOBIN A1C No results found for: "HGBA1C", "MPG"  External Labs:  Date Collected: 12/09/2020 , information obtained by PCP Potassium: 4.0 Creatinine 0.97 mg/dL. eGFR: 63 mL/min per 1.73 m Hemoglobin: 15.6 g/dL and hematocrit: 47.7 % Lipid profile: Total cholesterol 198 , triglycerides 90 , HDL 61 , LDL 118 AST: 17 , ALT: 8 , alkaline phosphatase: 66  Hemoglobin A1c: 6.3 TSH: 1.12    IMPRESSION:    ICD-10-CM   1. Atherosclerosis of native coronary artery of native heart without angina pectoris  I25.10 EKG 12-Lead    2. Elevated coronary artery calcium score  R93.1     3. Benign hypertension  I10     4. LBBB (left bundle branch block)  I44.7         RECOMMENDATIONS: Arrin Ishler is a 71 y.o. female whose past medical history and cardiac risk factors include: Moderate coronary artery calcification, nonobstructive CAD, NIDDM, hypertension, left bundle branch block, post menopausal female, advance age.   Atherosclerosis of native coronary artery of native heart without angina pectoris/moderate coronary artery calcification Stable. EKG: Nonischemic. Total CAC 104, 70th percentile. Coronary CTA: CAD RADS 3 CT FFR notes hemodynamically significant stenosis in the first diagonal branch. Her diagonal disease has been managed medically with up titration of antianginal therapy and improving her modifiable cardiovascular risk factors. Since her coronary CTA she has increased her physical activity and does not have any effort  related anginal discomfort or shortness of breath. Medications reconciled. Continue aspirin and statin therapy. Recently had labs with PCP patient is asked to have a copy forwarded to [place] for reference.  Benign hypertension Office blood pressures are well controlled. Home blood pressures are usually 130 mmHg or less. Medications reconciled. No changes warranted at this time. Currently managed by primary care provider.  LBBB (left bundle branch block) Chronic and stable   FINAL MEDICATION LIST END OF ENCOUNTER: No orders of the defined types were placed  in this encounter.    There are no discontinued medications.   Current Outpatient Medications:    aspirin EC 81 MG tablet, Take 1 tablet (81 mg total) by mouth daily. Swallow whole., Disp: 30 tablet, Rfl: 11   atenolol-chlorthalidone (TENORETIC) 50-25 MG tablet, Take 1 tablet by mouth daily., Disp: , Rfl:    diphenhydramine-acetaminophen (TYLENOL PM EXTRA STRENGTH) 25-500 MG TABS tablet, Take 1 tablet by mouth at bedtime as needed., Disp: , Rfl:    olmesartan (BENICAR) 40 MG tablet, Take 1 tablet (40 mg total) by mouth daily at 10 pm., Disp: 90 tablet, Rfl: 2   rosuvastatin (CRESTOR) 20 MG tablet, Take 1 tablet (20 mg total) by mouth at bedtime., Disp: 90 tablet, Rfl: 3  Orders Placed This Encounter  Procedures   EKG 12-Lead    There are no Patient Instructions on file for this visit.   --Continue cardiac medications as reconciled in final medication list. --Return in about 6 months (around 07/15/2022) for Follow up, Coronary artery calcification, CAD. Or sooner if needed. --Continue follow-up with your primary care physician regarding the management of your other chronic comorbid conditions.  Patient's questions and concerns were addressed to her satisfaction. She voices understanding of the instructions provided during this encounter.   This note was created using a voice recognition software as a result there may be  grammatical errors inadvertently enclosed that do not reflect the nature of this encounter. Every attempt is made to correct such errors.  Rex Kras, [place], [facility]  Pager: [phone] Office: [phone]

## 2022-01-15 ENCOUNTER — Telehealth: Payer: Self-pay

## 2022-01-15 NOTE — Telephone Encounter (Signed)
Zoe Cooper called to cancel her punch biopsy appointment with Dr. Lilian Kapur on 01/20/2022. She stated her rent went up and she doesn't have any money for this appointment. She also stated she has no one to stay with her family member that she takes care of.

## 2022-01-20 ENCOUNTER — Ambulatory Visit: Payer: Medicare Other | Admitting: Podiatry

## 2022-01-22 DIAGNOSIS — E1165 Type 2 diabetes mellitus with hyperglycemia: Secondary | ICD-10-CM | POA: Diagnosis not present

## 2022-01-22 DIAGNOSIS — E782 Mixed hyperlipidemia: Secondary | ICD-10-CM | POA: Diagnosis not present

## 2022-01-22 DIAGNOSIS — I10 Essential (primary) hypertension: Secondary | ICD-10-CM | POA: Diagnosis not present

## 2022-01-22 DIAGNOSIS — N1832 Chronic kidney disease, stage 3b: Secondary | ICD-10-CM | POA: Diagnosis not present

## 2022-01-27 ENCOUNTER — Encounter: Payer: Medicare Other | Admitting: Podiatry

## 2022-02-23 ENCOUNTER — Ambulatory Visit (INDEPENDENT_AMBULATORY_CARE_PROVIDER_SITE_OTHER): Payer: Medicare Other | Admitting: Podiatry

## 2022-02-23 ENCOUNTER — Encounter: Payer: Self-pay | Admitting: Podiatry

## 2022-02-23 DIAGNOSIS — Q828 Other specified congenital malformations of skin: Secondary | ICD-10-CM

## 2022-02-23 DIAGNOSIS — M79675 Pain in left toe(s): Secondary | ICD-10-CM

## 2022-02-23 DIAGNOSIS — E6609 Other obesity due to excess calories: Secondary | ICD-10-CM | POA: Insufficient documentation

## 2022-02-23 DIAGNOSIS — M79674 Pain in right toe(s): Secondary | ICD-10-CM | POA: Diagnosis not present

## 2022-02-23 DIAGNOSIS — B351 Tinea unguium: Secondary | ICD-10-CM

## 2022-02-23 DIAGNOSIS — L819 Disorder of pigmentation, unspecified: Secondary | ICD-10-CM | POA: Diagnosis not present

## 2022-02-24 NOTE — Progress Notes (Signed)
  Subjective:  Patient ID: Zoe Cooper, female    DOB: 09-Jul-1950,  MRN: [id number]  Zoe Cooper presents to clinic today for at risk foot care. Patient has history of polycythemia and painful mycotic toenails b/l. Patient also has suspicious lesion noted on right heel and was being worked up for biopsy of lesion with Dr. Lanae Crumbly, but has canceled her appointment. She states Dr. Vista Lawman wrote a referral for her to see a Podiatrist with [facility]. She is apprehensive of following through with biopsy due to her being the primary caregiver for her sister, Earlie Server.  New problem(s): None.   PCP is Benito Mccreedy, MD , and last visit was  January 13, 2022.  No Known Allergies  Review of Systems: Negative except as noted in the HPI.  Objective: No changes noted in today's physical examination.  Zoe Cooper is a pleasant 71 y.o. female WD, WN in NAD. AAO x 3. Vascular Examination:  CFT <3 seconds b/l LE. Palpable DP pulse(s) b/l LE. Palpable PT pulse(s) b/l LE. Pedal hair sparse. No pain with calf compression b/l. Lower extremity skin temperature gradient within normal limits. No edema noted b/l LE. No ischemia or gangrene noted b/l LE. No cyanosis or clubbing noted b/l LE.  Dermatological Examination: Pictures from 02/23/2022:                  Picture from 08/12/2021:  Picture from 11/19/2021:   Picture from 08/12/2021:   Picture from 11/19/2021:  No open wounds b/l LE. No interdigital macerations noted b/l LE. Toenails 1-5 b/l elongated, discolored, dystrophic, thickened, crumbly with subungual debris and tenderness to dorsal palpation.   No hyperkeratotic or porokeratotic lesions noted today.  Suspicious lesion noted medial aspect right heel. Characteristics of lesion: asymmetry, irregular borders, variegated color, possesses subcutaneous nodularity, and measurements are 2.5 x 2.5 cm.  Musculoskeletal: Muscle strength 5/5 to all lower extremity muscle groups  bilaterally. HAV with bunion deformity noted b/l LE. Hammertoe deformity noted 2-5 b/l. Patient ambulates independent of any assistive aids.  Neurological: Protective sensation intact 5/5 intact bilaterally with 10g monofilament b/l. Vibratory sensation intact b/l. Deep tendon reflexes normal b/l.   Assessment/Plan: 1. Pain due to onychomycosis of toenails of both feet   2. Pigmented skin lesion suspicious for malignant neoplasm     No orders of the defined types were placed in this encounter.   -Consent given for treatment as described below: -Examined patient. -Discussed right foot lesion with Ms. Salamon. Informed her I would encourage her to see another Podiatrist for a 3rd opinion per Dr. Matthias Hughs recommendation. She had started process for biopsy of lesion with Dr. Sherryle Lis, but no-showed on 01/06/2022. I explained to her my concern is that she have the biopsy performed, even if it's with another physician's office. She related understanding. -Mycotic toenails 1-5 bilaterally were debrided in length and girth with sterile nail nippers and dremel without incident. -Patient/POA to call should there be question/concern in the interim.   Return in about 3 months (around 05/26/2022) for nail care.  Marzetta Board, DPM

## 2022-02-27 NOTE — Progress Notes (Signed)
The office visit notes from 02/23/2022 have been sent to Dr. Matthias Hughs office per your request.

## 2022-03-23 DIAGNOSIS — E1165 Type 2 diabetes mellitus with hyperglycemia: Secondary | ICD-10-CM | POA: Diagnosis not present

## 2022-03-23 DIAGNOSIS — I10 Essential (primary) hypertension: Secondary | ICD-10-CM | POA: Diagnosis not present

## 2022-03-23 DIAGNOSIS — E782 Mixed hyperlipidemia: Secondary | ICD-10-CM | POA: Diagnosis not present

## 2022-03-23 DIAGNOSIS — N1832 Chronic kidney disease, stage 3b: Secondary | ICD-10-CM | POA: Diagnosis not present

## 2022-04-06 DIAGNOSIS — I10 Essential (primary) hypertension: Secondary | ICD-10-CM | POA: Diagnosis not present

## 2022-04-06 DIAGNOSIS — E782 Mixed hyperlipidemia: Secondary | ICD-10-CM | POA: Diagnosis not present

## 2022-04-06 DIAGNOSIS — E1165 Type 2 diabetes mellitus with hyperglycemia: Secondary | ICD-10-CM | POA: Diagnosis not present

## 2022-04-06 DIAGNOSIS — N1832 Chronic kidney disease, stage 3b: Secondary | ICD-10-CM | POA: Diagnosis not present

## 2022-06-15 ENCOUNTER — Ambulatory Visit: Payer: 59 | Admitting: Podiatry

## 2022-07-16 ENCOUNTER — Ambulatory Visit: Payer: 59 | Admitting: Cardiology

## 2022-07-20 DIAGNOSIS — H35341 Macular cyst, hole, or pseudohole, right eye: Secondary | ICD-10-CM | POA: Diagnosis not present

## 2022-07-20 DIAGNOSIS — H5203 Hypermetropia, bilateral: Secondary | ICD-10-CM | POA: Diagnosis not present

## 2022-07-20 DIAGNOSIS — H2513 Age-related nuclear cataract, bilateral: Secondary | ICD-10-CM | POA: Diagnosis not present

## 2022-07-20 DIAGNOSIS — E119 Type 2 diabetes mellitus without complications: Secondary | ICD-10-CM | POA: Diagnosis not present

## 2022-07-20 DIAGNOSIS — H43813 Vitreous degeneration, bilateral: Secondary | ICD-10-CM | POA: Diagnosis not present

## 2022-07-20 DIAGNOSIS — Z7984 Long term (current) use of oral hypoglycemic drugs: Secondary | ICD-10-CM | POA: Diagnosis not present

## 2022-07-20 DIAGNOSIS — H35371 Puckering of macula, right eye: Secondary | ICD-10-CM | POA: Diagnosis not present

## 2022-07-27 ENCOUNTER — Encounter: Payer: Self-pay | Admitting: Cardiology

## 2022-07-27 ENCOUNTER — Ambulatory Visit: Payer: 59 | Admitting: Cardiology

## 2022-07-27 VITALS — BP 136/80 | HR 60 | Resp 17 | Ht 66.0 in | Wt 192.6 lb

## 2022-07-27 DIAGNOSIS — I2510 Atherosclerotic heart disease of native coronary artery without angina pectoris: Secondary | ICD-10-CM

## 2022-07-27 DIAGNOSIS — I447 Left bundle-branch block, unspecified: Secondary | ICD-10-CM

## 2022-07-27 DIAGNOSIS — R931 Abnormal findings on diagnostic imaging of heart and coronary circulation: Secondary | ICD-10-CM | POA: Diagnosis not present

## 2022-07-27 DIAGNOSIS — I10 Essential (primary) hypertension: Secondary | ICD-10-CM | POA: Diagnosis not present

## 2022-07-27 NOTE — Progress Notes (Signed)
Date:  07/27/2022   ID:  Zoe Cooper, DOB May 15, 1951, MRN [id number]  PCP:  Benito Mccreedy, MD  Cardiologist:  Rex Kras, DO, [facility]  (established care 12/19/2020)  Date: 07/27/22 Last Office Visit: 01/12/2022  Chief Complaint  Patient presents with   Atherosclerosis of native coronary artery of native heart w   Follow-up    HPI  Zoe Cooper is a 72 y.o. female whose past medical history and cardiovascular risk factors include: Moderate coronary artery calcification, nonobstructive CAD, NIDDM, hypertension, left bundle branch block, post menopausal female, advance age.   Patient has known history of nonobstructive CAD presents today for 26-monthfollow-up visit.  In the past she did undergo coronary CTA along with CT FFR which noted hemodynamically significant stenosis in the mid to distal first diagonal branch.  However, based on coronary CTA data degree of stenosis is likely less than 50%.  With uptitration of medical therapy she has been asymptomatic.  Over the last 6 months she denies anginal discomfort or heart failure symptoms.  She continues to exercise at least 7 days a week for 15 minutes at a time and has not noticed any change in physical endurance.  Home blood pressures are around 120/70 mmHg.  No hospitalizations or urgent care visits since last office encounter.  ALLERGIES: No Known Allergies  MEDICATION LIST PRIOR TO VISIT: Current Meds  Medication Sig   aspirin EC 81 MG tablet Take 1 tablet (81 mg total) by mouth daily. Swallow whole.   atenolol-chlorthalidone (TENORETIC) 50-25 MG tablet Take 1 tablet by mouth daily.   diphenhydramine-acetaminophen (TYLENOL PM EXTRA STRENGTH) 25-500 MG TABS tablet Take 1 tablet by mouth at bedtime as needed.   glimepiride (AMARYL) 2 MG tablet Take 1 tablet by mouth daily.   olmesartan (BENICAR) 40 MG tablet Take 1 tablet (40 mg total) by mouth daily at 10 pm.   rosuvastatin (CRESTOR) 20 MG tablet Take 1 tablet (20 mg total) by  mouth at bedtime.     PAST MEDICAL HISTORY: Past Medical History:  Diagnosis Date   Coronary atherosclerosis due to calcified coronary lesion    Diabetes mellitus without complication (HCC)    Hyperlipidemia    Hypertension    LBBB (left bundle branch block)     PAST SURGICAL HISTORY: Past Surgical History:  Procedure Laterality Date   ABDOMINAL HYSTERECTOMY      FAMILY HISTORY: The patient family history includes Aneurysm in her sister; Cancer in her sister; Heart attack in her father; Heart disease in her mother; Hypertension in her mother; Lung cancer in her brother; Uterine cancer in her sister.  SOCIAL HISTORY:  The patient  reports that she has quit smoking. Her smoking use included cigarettes. She smoked an average of .25 packs per day. She has never used smokeless tobacco. She reports that she does not currently use alcohol. She reports that she does not use drugs.  REVIEW OF SYSTEMS: Review of Systems  Constitutional: Negative for chills and fever.  HENT:  Negative for hoarse voice and nosebleeds.   Eyes:  Negative for discharge, double vision and pain.  Cardiovascular:  Negative for chest pain, claudication, dyspnea on exertion, leg swelling, near-syncope, orthopnea, palpitations, paroxysmal nocturnal dyspnea and syncope.  Respiratory:  Negative for hemoptysis and shortness of breath.   Musculoskeletal:  Negative for muscle cramps and myalgias.  Gastrointestinal:  Negative for abdominal pain, constipation, diarrhea, hematemesis, hematochezia, melena, nausea and vomiting.  Neurological:  Negative for dizziness and light-headedness.    PHYSICAL EXAM:  07/27/2022    2:52 PM 01/12/2022    2:42 PM 08/12/2021   11:41 AM  Vitals with BMI  Height '5\' 6"'$  '5\' 6"'$    Weight 192 lbs 10 oz 198 lbs   BMI [id number]   Systolic [id number]  Diastolic 80 79 [id number]  Pulse 60 55 81   Physical Exam  Constitutional: No distress.  Age appropriate, hemodynamically stable.   Neck: No  JVD present.  Cardiovascular: Normal rate, regular rhythm, S1 normal, S2 normal, intact distal pulses and normal pulses. Exam reveals no gallop, no S3 and no S4.  No murmur heard. Pulmonary/Chest: Effort normal and breath sounds normal. No stridor. She has no wheezes. She has no rales.  Abdominal: Soft. Bowel sounds are normal. She exhibits no distension. There is no abdominal tenderness.  Musculoskeletal:        General: No edema.     Cervical back: Neck supple.  Neurological: She is alert and oriented to person, place, and time. She has intact cranial nerves (2-12).  Skin: Skin is warm and moist.   CARDIAC DATABASE: EKG: 07/27/2022: Sinus bradycardia, 54 bpm, left bundle branch block, left axis, no significant change compared to 01/12/2022.  Echocardiogram: 03/03/2021:  Hyperdynamic LV systolic function with visual EF >70%. Left ventricle cavity is normal in size. Severe left ventricular hypertrophy. Normal global wall motion. Normal diastolic filling pattern, normal LAP.  Mild tricuspid regurgitation. No evidence of pulmonary hypertension.  No prior study for comparison.   Stress Testing: Lexiscan/modified Bruce Tetrofosmin stress test 03/03/2021: Lexiscan/modified Bruce nuclear stress test performed using 1-day protocol. Patient walked for 2.2 METS and reached 71% MPHR. No chest pain reported, dyspnea reported.  SPECT images show significant breast tissue attenuation in inferior myocardium , with imaging performed in sitting position. Thus, ischemia in this region cannot be excluded.  Stress LVEF 60%.  Given TID of 1.3, clinical correlation is recommended.   Heart Catheterization: None  CCTA 04/15/2021: 1. Total coronary calcium score of 104. This was 78th percentile for age and sex matched control. 2. Normal coronary origin with right dominance. 3. CAD-RADS = 3. Left Main: Patent. LAD: Mid segment after the first diagonal branch moderate stenosis (50-69%) due to mixed plaque,  otherwise patent. D1: Mild stenosis (25-49%) at ostial segment of first diagonal branch. LCX: Patent. OM1: Patent. RCA: Mild stenosis (25-49%) within distal RCA due to non-calcified plaque. 4. Aortic atherosclerosis. 5. Study is sent for CT-FFR. Findings will be performed and reported separately. 6. No acute findings in the imaged extracardiac chest. 7. Esophageal air fluid level suggests dysmotility or gastroesophageal reflux   RECOMMENDATIONS: Consider symptom-guided anti-ischemic pharmacotherapy as well as risk factor modification per guideline directed care. Additional analysis with CT FFR will be submitted.  CT-FFR 04/23/2021: 1. CT FFR analysis showed significant stenosis within the first diagonal branch.   RECOMMENDATIONS: Recommend goal directed and antianginal therapy with aggressive risk factor modification for secondary prevention of coronary artery disease. However, if still symptomatic consider invasive angiography for further evaluation. Clinical correlation is required  LABORATORY DATA:     No data to display             Latest Ref Rng & Units 10/27/2021   11:37 AM 06/18/2021    2:07 PM 05/22/2021   11:34 AM  CMP  Glucose 70 - 99 mg/dL 122  86  90   BUN 8 - 27 mg/dL 35  15  14   Creatinine 0.57 - 1.00 mg/dL 1.26  1.13  1.10   Sodium 134 - 144 mmol/L 141  138  141   Potassium 3.5 - 5.2 mmol/L 4.4  4.2  4.2   Chloride 96 - 106 mmol/L 101  95  101   CO2 20 - 29 mmol/L '24  27  25   '$ Calcium 8.7 - 10.3 mg/dL 10.5  10.6  10.3   Total Protein 6.0 - 8.5 g/dL 8.0     Total Bilirubin 0.0 - 1.2 mg/dL 0.5     Alkaline Phos 44 - 121 IU/L 63     AST 0 - 40 IU/L 18     ALT 0 - 32 IU/L 7       Lipid Panel     Component Value Date/Time   CHOL 133 10/27/2021 1137   TRIG 136 10/27/2021 1137   HDL 54 10/27/2021 1137   LDLCALC 55 10/27/2021 1137   LDLDIRECT 54 10/27/2021 1137   LABVLDL 24 10/27/2021 1137    No components found for: "NTPROBNP" No results for  input(s): "PROBNP" in the last 8760 hours. No results for input(s): "TSH" in the last 8760 hours.  BMP Recent Labs    10/27/21 1137  NA 141  K 4.4  CL 101  CO2 24  GLUCOSE 122*  BUN 35*  CREATININE 1.26*  CALCIUM 10.5*    HEMOGLOBIN A1C No results found for: "HGBA1C", "MPG"  External Labs:  Date Collected: 12/09/2020 , information obtained by PCP Potassium: 4.0 Creatinine 0.97 mg/dL. eGFR: 63 mL/min per 1.73 m Hemoglobin: 15.6 g/dL and hematocrit: 47.7 % Lipid profile: Total cholesterol 198 , triglycerides 90 , HDL 61 , LDL 118 AST: 17 , ALT: 8 , alkaline phosphatase: 66  Hemoglobin A1c: 6.3 TSH: 1.12    IMPRESSION:    ICD-10-CM   1. Atherosclerosis of native coronary artery of native heart without angina pectoris  I25.10 EKG 12-Lead    Lipid Panel With LDL/HDL Ratio    LDL cholesterol, direct    CMP14+EGFR    2. Elevated coronary artery calcium score  R93.1     3. Benign hypertension  I10     4. LBBB (left bundle branch block)  I44.7         RECOMMENDATIONS: Zoe Cooper is a 72 y.o. female whose past medical history and cardiac risk factors include: Moderate coronary artery calcification, nonobstructive CAD, NIDDM, hypertension, left bundle branch block, post menopausal female, advance age.   Atherosclerosis of native coronary artery of native heart without angina pectoris Moderate coronary artery calcification Stable. EKG: Nonischemic. Total CAC 104, 70th percentile. Coronary CTA: CAD RADS 3 CT FFR notes hemodynamically significant stenosis in the first diagonal branch. Her diagonal disease has been managed medically with up titration of antianginal therapy and improving her modifiable cardiovascular risk factors. She denies anginal discomfort at rest or with effort related activities. Medications reconciled. Will check fasting lipid profile prior to next.  Benign hypertension Office and home blood pressures are well-controlled. Medications  reconciled. No changes warranted at this time.  LBBB (left bundle branch block) Chronic and stable   FINAL MEDICATION LIST END OF ENCOUNTER: No orders of the defined types were placed in this encounter.    There are no discontinued medications.   Current Outpatient Medications:    aspirin EC 81 MG tablet, Take 1 tablet (81 mg total) by mouth daily. Swallow whole., Disp: 30 tablet, Rfl: 11   atenolol-chlorthalidone (TENORETIC) 50-25 MG tablet, Take 1 tablet by mouth daily., Disp: , Rfl:    diphenhydramine-acetaminophen (TYLENOL  PM EXTRA STRENGTH) 25-500 MG TABS tablet, Take 1 tablet by mouth at bedtime as needed., Disp: , Rfl:    glimepiride (AMARYL) 2 MG tablet, Take 1 tablet by mouth daily., Disp: , Rfl:    olmesartan (BENICAR) 40 MG tablet, Take 1 tablet (40 mg total) by mouth daily at 10 pm., Disp: 90 tablet, Rfl: 2   rosuvastatin (CRESTOR) 20 MG tablet, Take 1 tablet (20 mg total) by mouth at bedtime., Disp: 90 tablet, Rfl: 3  Orders Placed This Encounter  Procedures   Lipid Panel With LDL/HDL Ratio   LDL cholesterol, direct   CMP14+EGFR   EKG 12-Lead    There are no Patient Instructions on file for this visit.   --Continue cardiac medications as reconciled in final medication list. --Return in about 6 months (around 01/27/2023) for Follow up, CAD, labs [facility]'$ . Or sooner if needed. --Continue follow-up with your primary care physician regarding the management of your other chronic comorbid conditions.  Patient's questions and concerns were addressed to her satisfaction. She voices understanding of the instructions provided during this encounter.   This note was created using a voice recognition software as a result there may be grammatical errors inadvertently enclosed that do not reflect the nature of this encounter. Every attempt is made to correct such errors.  Rex Kras, [place], [facility]  Pager:  [phone] Office: [phone]

## 2022-09-22 ENCOUNTER — Encounter: Payer: Self-pay | Admitting: Podiatry

## 2022-09-22 ENCOUNTER — Ambulatory Visit (INDEPENDENT_AMBULATORY_CARE_PROVIDER_SITE_OTHER): Payer: 59 | Admitting: Podiatry

## 2022-09-22 DIAGNOSIS — M79674 Pain in right toe(s): Secondary | ICD-10-CM

## 2022-09-22 DIAGNOSIS — B351 Tinea unguium: Secondary | ICD-10-CM | POA: Diagnosis not present

## 2022-09-22 DIAGNOSIS — M79675 Pain in left toe(s): Secondary | ICD-10-CM

## 2022-09-22 NOTE — Progress Notes (Signed)
  Subjective:  Patient ID: Zoe Cooper, female    DOB: 02/18/1951,  MRN: [id number]  Zoe Cooper presents to clinic today for preventative diabetic foot care and painful thick toenails that are difficult to trim. Pain interferes with ambulation. Aggravating factors include wearing enclosed shoe gear. Pain is relieved with periodic professional debridement.  Chief Complaint  Patient presents with   Diabetes    DFC BS - DIDN'T CHECK IT TODAY, YESTERDAY 130  A1C - 6.7 LVPCP - 03/2022 , NEXT APPT ON 09/28/22   New problem(s): None.   Patient is still not interested in having lesion on right foot biopsied.  She states at times, she does file lesion.  She is accompanied by her sister, Zoe Cooper, on today's visit. She is Zoe Cooper' primary caregiver.  PCP is Osei-Bonsu, Greggory Stallion, MD.  No Known Allergies  Review of Systems: Negative except as noted in the HPI.  Objective: No changes noted in today's physical examination. There were no vitals filed for this visit. Zoe Cooper is a pleasant 72 y.o. female WD, WN in NAD. AAO x 3.  Vascular Examination:  CFT <3 seconds b/l LE. Palpable DP pulse(s) b/l LE. Palpable PT pulse(s) b/l LE. Pedal hair sparse. No pain with calf compression b/l. Lower extremity skin temperature gradient within normal limits. No edema noted b/l LE. No ischemia or gangrene noted b/l LE. No cyanosis or clubbing noted b/l LE.  Dermatological Examination: No open wounds b/l LE. No interdigital macerations noted b/l LE. Toenails 1-5 b/l elongated, discolored, dystrophic, thickened, crumbly with subungual debris and tenderness to dorsal palpation.   No hyperkeratotic or porokeratotic lesions noted today.  Suspicious lesion noted medial aspect right heel. Characteristics of lesion: asymmetry, irregular borders, variegated color, possesses subcutaneous nodularity, and measurements are 2.5 x 2.5 cm.     Pictures from previous visit:     Musculoskeletal: Muscle strength 5/5 to  all lower extremity muscle groups bilaterally. HAV with bunion deformity noted b/l LE. Hammertoe deformity noted 2-5 b/l. Patient ambulates independent of any assistive aids.  Neurological: Protective sensation intact 5/5 intact bilaterally with 10g monofilament b/l. Vibratory sensation intact b/l. Deep tendon reflexes normal b/l.   Assessment/Plan: 1. Pain due to onychomycosis of toenails of both feet     Patient was evaluated and treated. All patient's and/or POA's questions/concerns addressed on today's visit. Toenails 1-5 debrided in length and girth without incident. Continue soft, supportive shoe gear daily. Report any pedal injuries to medical professional. -Patient/POA to call should there be question/concern in the interim.   Return in about 3 months (around 12/23/2022).  Freddie Breech, DPM

## 2022-10-01 DIAGNOSIS — E782 Mixed hyperlipidemia: Secondary | ICD-10-CM | POA: Diagnosis not present

## 2022-10-01 DIAGNOSIS — Z0001 Encounter for general adult medical examination with abnormal findings: Secondary | ICD-10-CM | POA: Diagnosis not present

## 2022-10-01 DIAGNOSIS — E1165 Type 2 diabetes mellitus with hyperglycemia: Secondary | ICD-10-CM | POA: Diagnosis not present

## 2022-10-01 DIAGNOSIS — N1832 Chronic kidney disease, stage 3b: Secondary | ICD-10-CM | POA: Diagnosis not present

## 2022-10-01 DIAGNOSIS — I10 Essential (primary) hypertension: Secondary | ICD-10-CM | POA: Diagnosis not present

## 2022-10-29 DIAGNOSIS — E1165 Type 2 diabetes mellitus with hyperglycemia: Secondary | ICD-10-CM | POA: Diagnosis not present

## 2022-10-29 DIAGNOSIS — I10 Essential (primary) hypertension: Secondary | ICD-10-CM | POA: Diagnosis not present

## 2022-10-29 DIAGNOSIS — E782 Mixed hyperlipidemia: Secondary | ICD-10-CM | POA: Diagnosis not present

## 2022-10-29 DIAGNOSIS — N1832 Chronic kidney disease, stage 3b: Secondary | ICD-10-CM | POA: Diagnosis not present

## 2022-10-29 DIAGNOSIS — Z0001 Encounter for general adult medical examination with abnormal findings: Secondary | ICD-10-CM | POA: Diagnosis not present

## 2022-12-30 NOTE — Telephone Encounter (Signed)
done

## 2023-01-11 ENCOUNTER — Ambulatory Visit: Payer: 59 | Admitting: Podiatry

## 2023-01-25 DIAGNOSIS — E782 Mixed hyperlipidemia: Secondary | ICD-10-CM | POA: Diagnosis not present

## 2023-01-25 DIAGNOSIS — N1832 Chronic kidney disease, stage 3b: Secondary | ICD-10-CM | POA: Diagnosis not present

## 2023-01-25 DIAGNOSIS — E1165 Type 2 diabetes mellitus with hyperglycemia: Secondary | ICD-10-CM | POA: Diagnosis not present

## 2023-01-25 DIAGNOSIS — I10 Essential (primary) hypertension: Secondary | ICD-10-CM | POA: Diagnosis not present

## 2023-01-28 ENCOUNTER — Ambulatory Visit: Payer: 59 | Admitting: Cardiology

## 2023-01-28 ENCOUNTER — Encounter: Payer: Self-pay | Admitting: Cardiology

## 2023-01-28 VITALS — BP 131/76 | HR 57 | Resp 16 | Ht 66.0 in | Wt 195.0 lb

## 2023-01-28 DIAGNOSIS — R931 Abnormal findings on diagnostic imaging of heart and coronary circulation: Secondary | ICD-10-CM

## 2023-01-28 DIAGNOSIS — I447 Left bundle-branch block, unspecified: Secondary | ICD-10-CM | POA: Diagnosis not present

## 2023-01-28 DIAGNOSIS — I2510 Atherosclerotic heart disease of native coronary artery without angina pectoris: Secondary | ICD-10-CM | POA: Diagnosis not present

## 2023-01-28 DIAGNOSIS — I10 Essential (primary) hypertension: Secondary | ICD-10-CM | POA: Diagnosis not present

## 2023-01-28 NOTE — Progress Notes (Addendum)
ID:  Zoe Cooper, DOB 10-05-1950, MRN [id number]  PCP:  Jackie Plum, MD  Cardiologist:  Tessa Lerner, DO, [facility]  (established care 12/19/2020)  Date: 01/28/23 Last Office Visit: 07/27/2022  Chief Complaint  Patient presents with   Coronary Artery Disease   Follow-up    HPI  Zoe Cooper is a 72 y.o. female whose past medical history and cardiovascular risk factors include: Moderate coronary artery calcification, nonobstructive CAD, NIDDM, hypertension, left bundle branch block, post menopausal female, advance age.   Patient has known history of nonobstructive CAD presents today for 29-month follow-up visit.  Her coronary CTA along with CT FFR in November 2022 noted hemodynamically significant stenosis in the mid to distal first diagonal branch.  However, based on coronary CTA data degree of stenosis is likely less than 50%.  With uptitration of medical therapy she has been asymptomatic.  Over the last 6 months patient denies anginal chest pain or heart failure symptoms.  Overall functional capacity has reduced due to being busy with other life activities.  She does not endorse any change in physical endurance.  She had labs with her PCP earlier this month on 01/25/2023 I do not have those labs available for review.  I checked the media section, [facility] database, as well as Care Everywhere.    ALLERGIES: No Known Allergies  MEDICATION LIST PRIOR TO VISIT: Current Meds  Medication Sig   aspirin EC 81 MG tablet Take 1 tablet (81 mg total) by mouth daily. Swallow whole.   atenolol-chlorthalidone (TENORETIC) 50-25 MG tablet Take 1 tablet by mouth daily.   diphenhydramine-acetaminophen (TYLENOL PM EXTRA STRENGTH) 25-500 MG TABS tablet Take 1 tablet by mouth at bedtime as needed.   glimepiride (AMARYL) 2 MG tablet Take 1 tablet by mouth daily.   olmesartan (BENICAR) 40 MG tablet Take 1 tablet (40 mg total) by mouth daily at 10 pm.   rosuvastatin (CRESTOR) 20 MG tablet Take 1 tablet (20  mg total) by mouth at bedtime.     PAST MEDICAL HISTORY: Past Medical History:  Diagnosis Date   Coronary atherosclerosis due to calcified coronary lesion    Diabetes mellitus without complication (HCC)    Hyperlipidemia    Hypertension    LBBB (left bundle branch block)     PAST SURGICAL HISTORY: Past Surgical History:  Procedure Laterality Date   ABDOMINAL HYSTERECTOMY      FAMILY HISTORY: The patient family history includes Aneurysm in her sister; Cancer in her sister; Heart attack in her father; Heart disease in her mother; Hypertension in her mother; Lung cancer in her brother; Uterine cancer in her sister.  SOCIAL HISTORY:  The patient  reports that she has quit smoking. Her smoking use included cigarettes. She has never used smokeless tobacco. She reports that she does not currently use alcohol. She reports that she does not use drugs.  REVIEW OF SYSTEMS: Review of Systems  Constitutional: Negative for chills and fever.  HENT:  Negative for hoarse voice and nosebleeds.   Eyes:  Negative for discharge, double vision and pain.  Cardiovascular:  Negative for chest pain, claudication, dyspnea on exertion, leg swelling, near-syncope, orthopnea, palpitations, paroxysmal nocturnal dyspnea and syncope.  Respiratory:  Negative for hemoptysis and shortness of breath.   Musculoskeletal:  Negative for muscle cramps and myalgias.  Gastrointestinal:  Negative for abdominal pain, constipation, diarrhea, hematemesis, hematochezia, melena, nausea and vomiting.  Neurological:  Negative for dizziness and light-headedness.    PHYSICAL EXAM:    01/28/2023   11:13  AM 07/27/2022    2:52 PM 01/12/2022    2:42 PM  Vitals with BMI  Height 5\' 6"  5\' 6"  5\' 6"   Weight 195 lbs 192 lbs 10 oz 198 lbs  BMI 31.49 31.1 31.97  Systolic 131 136 102  Diastolic 76 80 79  Pulse 57 60 55   Physical Exam  Constitutional: No distress.  Age appropriate, hemodynamically stable.   Neck: No JVD present.   Cardiovascular: Normal rate, regular rhythm, S1 normal, S2 normal, intact distal pulses and normal pulses. Exam reveals no gallop, no S3 and no S4.  No murmur heard. Pulmonary/Chest: Effort normal and breath sounds normal. No stridor. She has no wheezes. She has no rales.  Abdominal: Soft. Bowel sounds are normal. She exhibits no distension. There is no abdominal tenderness.  Musculoskeletal:        General: No edema.     Cervical back: Neck supple.  Neurological: She is alert and oriented to person, place, and time. She has intact cranial nerves (2-12).  Skin: Skin is warm and moist.   CARDIAC DATABASE: EKG: 01/28/2023: Sinus bradycardia, 53 bpm, left bundle branch block, left axis, PAC  Echocardiogram: 03/03/2021:  Hyperdynamic LV systolic function with visual EF >70%. Left ventricle cavity is normal in size. Severe left ventricular hypertrophy. Normal global wall motion. Normal diastolic filling pattern, normal LAP.  Mild tricuspid regurgitation. No evidence of pulmonary hypertension.  No prior study for comparison.   Stress Testing: Lexiscan/modified Bruce Tetrofosmin stress test 03/03/2021: Lexiscan/modified Bruce nuclear stress test performed using 1-day protocol. Patient walked for 2.2 METS and reached 71% MPHR. No chest pain reported, dyspnea reported.  SPECT images show significant breast tissue attenuation in inferior myocardium , with imaging performed in sitting position. Thus, ischemia in this region cannot be excluded.  Stress LVEF 60%.  Given TID of 1.3, clinical correlation is recommended.   Heart Catheterization: None  CCTA 04/15/2021: 1. Total coronary calcium score of 104. This was 78th percentile for age and sex matched control. 2. Normal coronary origin with right dominance. 3. CAD-RADS = 3. Left Main: Patent. LAD: Mid segment after the first diagonal branch moderate stenosis (50-69%) due to mixed plaque, otherwise patent. D1: Mild stenosis (25-49%) at ostial  segment of first diagonal branch. LCX: Patent. OM1: Patent. RCA: Mild stenosis (25-49%) within distal RCA due to non-calcified plaque. 4. Aortic atherosclerosis. 5. Study is sent for CT-FFR. Findings will be performed and reported separately. 6. No acute findings in the imaged extracardiac chest. 7. Esophageal air fluid level suggests dysmotility or gastroesophageal reflux   RECOMMENDATIONS: Consider symptom-guided anti-ischemic pharmacotherapy as well as risk factor modification per guideline directed care. Additional analysis with CT FFR will be submitted.  CT-FFR 04/23/2021: 1. CT FFR analysis showed significant stenosis within the first diagonal branch.   RECOMMENDATIONS: Recommend goal directed and antianginal therapy with aggressive risk factor modification for secondary prevention of coronary artery disease. However, if still symptomatic consider invasive angiography for further evaluation. Clinical correlation is required  LABORATORY DATA:     No data to display             Latest Ref Rng & Units 10/27/2021   11:37 AM 06/18/2021    2:07 PM 05/22/2021   11:34 AM  CMP  Glucose 70 - 99 mg/dL 725  86  90   BUN 8 - 27 mg/dL 35  15  14   Creatinine 0.57 - 1.00 mg/dL 3.66  4.40  3.47   Sodium 134 - 144  mmol/L 141  138  141   Potassium 3.5 - 5.2 mmol/L 4.4  4.2  4.2   Chloride 96 - 106 mmol/L 101  95  101   CO2 20 - 29 mmol/L 24  27  25    Calcium 8.7 - 10.3 mg/dL 16.1  09.6  04.5   Total Protein 6.0 - 8.5 g/dL 8.0     Total Bilirubin 0.0 - 1.2 mg/dL 0.5     Alkaline Phos 44 - 121 IU/L 63     AST 0 - 40 IU/L 18     ALT 0 - 32 IU/L 7       Lipid Panel     Component Value Date/Time   CHOL 133 10/27/2021 1137   TRIG 136 10/27/2021 1137   HDL 54 10/27/2021 1137   LDLCALC 55 10/27/2021 1137   LDLDIRECT 54 10/27/2021 1137   LABVLDL 24 10/27/2021 1137    No components found for: "NTPROBNP" No results for input(s): "PROBNP" in the last 8760 hours. No results for  input(s): "TSH" in the last 8760 hours.  BMP No results for input(s): "NA", "K", "CL", "CO2", "GLUCOSE", "BUN", "CREATININE", "CALCIUM", "GFRNONAA", "GFRAA" in the last 8760 hours.   HEMOGLOBIN A1C No results found for: "HGBA1C", "MPG"  External Labs:  Date Collected: 12/09/2020 , information obtained by PCP Potassium: 4.0 Creatinine 0.97 mg/dL. eGFR: 63 mL/min per 1.73 m Hemoglobin: 15.6 g/dL and hematocrit: 40.9 % Lipid profile: Total cholesterol 198 , triglycerides 90 , HDL 61 , LDL 118 AST: 17 , ALT: 8 , alkaline phosphatase: 66  Hemoglobin A1c: 6.3 TSH: 1.12    IMPRESSION:    ICD-10-CM   1. Atherosclerosis of native coronary artery of native heart without angina pectoris  I25.10 EKG 12-Lead    2. Elevated coronary artery calcium score  R93.1     3. Benign hypertension  I10     4. LBBB (left bundle branch block)  I44.7         RECOMMENDATIONS: Dalayah Stalling is a 72 y.o. female whose past medical history and cardiac risk factors include: Moderate coronary artery calcification, nonobstructive CAD, NIDDM, hypertension, left bundle branch block, post menopausal female, advance age.   Atherosclerosis of native coronary artery of native heart without angina pectoris Moderate coronary artery calcification Denies anginal chest pain. EKG remains nonischemic. No change in overall physical endurance. Total CAC 104, 70th percentile. Coronary CTA: CAD RADS 3 CT FFR notes hemodynamically significant stenosis in the first diagonal branch. Her diagonal disease has been managed medically since 2022 with up titration of antianginal therapy and improving her modifiable cardiovascular risk factors. Will obtain labs from PCP office to reevaluate her lipids.  Benign hypertension Office and home blood pressures are well-controlled. Medications reconciled. No changes warranted at this time.  LBBB (left bundle branch block) Chronic and stable  Since her coronary CTA results back in  November 2022 she has been observed closely with uptitration of medical therapy and improving her modifiable cardiovascular risk factors.  She is almost 2 years out without any cardiovascular symptoms and clinically she could be followed on an annual basis.  She clearly understands to seek medical attention if she has new onset of chest pain or to call the office back for reevaluation.  I have asked her to arrange her yearly follow-up visit after her well visit with PCP so there is recent labs that are available for review.  FINAL MEDICATION LIST END OF ENCOUNTER: No orders of the defined types were placed  in this encounter.    There are no discontinued medications.   Current Outpatient Medications:    aspirin EC 81 MG tablet, Take 1 tablet (81 mg total) by mouth daily. Swallow whole., Disp: 30 tablet, Rfl: 11   atenolol-chlorthalidone (TENORETIC) 50-25 MG tablet, Take 1 tablet by mouth daily., Disp: , Rfl:    diphenhydramine-acetaminophen (TYLENOL PM EXTRA STRENGTH) 25-500 MG TABS tablet, Take 1 tablet by mouth at bedtime as needed., Disp: , Rfl:    glimepiride (AMARYL) 2 MG tablet, Take 1 tablet by mouth daily., Disp: , Rfl:    olmesartan (BENICAR) 40 MG tablet, Take 1 tablet (40 mg total) by mouth daily at 10 pm., Disp: 90 tablet, Rfl: 2   rosuvastatin (CRESTOR) 20 MG tablet, Take 1 tablet (20 mg total) by mouth at bedtime., Disp: 90 tablet, Rfl: 3  Orders Placed This Encounter  Procedures   EKG 12-Lead    There are no Patient Instructions on file for this visit.   --Continue cardiac medications as reconciled in final medication list. --Return in about 1 year (around 01/28/2024) for Follow up, CAD. Or sooner if needed. --Continue follow-up with your primary care physician regarding the management of your other chronic comorbid conditions.  Patient's questions and concerns were addressed to her satisfaction. She voices understanding of the instructions provided during this encounter.    This note was created using a voice recognition software as a result there may be grammatical errors inadvertently enclosed that do not reflect the nature of this encounter. Every attempt is made to correct such errors.  Tessa Lerner, [place], [facility]  Pager:  [phone] Office: [phone]

## 2023-02-02 NOTE — Progress Notes (Signed)
Called patient to inform her about her labs. Patient needs an appt for 1 year f/u. Could you all call her please to set up an appt

## 2023-02-22 ENCOUNTER — Telehealth: Payer: Self-pay | Admitting: Cardiology

## 2023-02-22 NOTE — Telephone Encounter (Signed)
Patient called to follow-up on lab results. 

## 2023-02-22 NOTE — Telephone Encounter (Signed)
External Labs: Collected: 01/25/2023 provided by PCP. Total cholesterol 132, triglycerides 130, HDL 51, LDL calculated 60, non-HDL 81. BUN 21, creatinine 1.44. Sodium 139, potassium 4.9, chloride 103, bicarb 27. AST 17, ALT 7, alkaline phosphatase 51 Hemoglobin A1c 6.8. Hemoglobin 13.9   LDL levels are acceptable. Have her increase her water intake by 2 to 3 glasses/day. Still focus on lifestyle changes to improve her cardiovascular risk factors.   Please arrange 1 year follow-up visit as recommended at last visit   Regards,   Sunit Odis Hollingshead, DO, [facility]  Spoke with patient and she is aware of lab results and provider recommendations

## 2023-02-25 DIAGNOSIS — N1832 Chronic kidney disease, stage 3b: Secondary | ICD-10-CM | POA: Diagnosis not present

## 2023-02-25 DIAGNOSIS — Z23 Encounter for immunization: Secondary | ICD-10-CM | POA: Diagnosis not present

## 2023-02-25 DIAGNOSIS — E1165 Type 2 diabetes mellitus with hyperglycemia: Secondary | ICD-10-CM | POA: Diagnosis not present

## 2023-02-25 DIAGNOSIS — I10 Essential (primary) hypertension: Secondary | ICD-10-CM | POA: Diagnosis not present

## 2023-02-25 DIAGNOSIS — E782 Mixed hyperlipidemia: Secondary | ICD-10-CM | POA: Diagnosis not present

## 2023-03-25 ENCOUNTER — Other Ambulatory Visit: Payer: Self-pay | Admitting: Physician Assistant

## 2023-03-25 DIAGNOSIS — N1832 Chronic kidney disease, stage 3b: Secondary | ICD-10-CM | POA: Diagnosis not present

## 2023-03-25 DIAGNOSIS — I119 Hypertensive heart disease without heart failure: Secondary | ICD-10-CM | POA: Diagnosis not present

## 2023-03-25 DIAGNOSIS — E782 Mixed hyperlipidemia: Secondary | ICD-10-CM | POA: Diagnosis not present

## 2023-03-25 DIAGNOSIS — E1122 Type 2 diabetes mellitus with diabetic chronic kidney disease: Secondary | ICD-10-CM | POA: Diagnosis not present

## 2023-03-25 DIAGNOSIS — Z1231 Encounter for screening mammogram for malignant neoplasm of breast: Secondary | ICD-10-CM

## 2023-04-14 ENCOUNTER — Ambulatory Visit: Payer: 59 | Admitting: Podiatry

## 2023-09-14 DIAGNOSIS — Z13228 Encounter for screening for other metabolic disorders: Secondary | ICD-10-CM | POA: Diagnosis not present

## 2023-09-14 DIAGNOSIS — Z1329 Encounter for screening for other suspected endocrine disorder: Secondary | ICD-10-CM | POA: Diagnosis not present

## 2023-09-14 DIAGNOSIS — Z131 Encounter for screening for diabetes mellitus: Secondary | ICD-10-CM | POA: Diagnosis not present

## 2023-09-14 DIAGNOSIS — I10 Essential (primary) hypertension: Secondary | ICD-10-CM | POA: Diagnosis not present

## 2023-09-14 DIAGNOSIS — Z0000 Encounter for general adult medical examination without abnormal findings: Secondary | ICD-10-CM | POA: Diagnosis not present

## 2023-09-14 DIAGNOSIS — Z1159 Encounter for screening for other viral diseases: Secondary | ICD-10-CM | POA: Diagnosis not present

## 2023-09-14 DIAGNOSIS — Z130 Encounter for screening for diseases of the blood and blood-forming organs and certain disorders involving the immune mechanism: Secondary | ICD-10-CM | POA: Diagnosis not present

## 2023-09-14 DIAGNOSIS — Z1321 Encounter for screening for nutritional disorder: Secondary | ICD-10-CM | POA: Diagnosis not present

## 2023-09-14 DIAGNOSIS — E782 Mixed hyperlipidemia: Secondary | ICD-10-CM | POA: Diagnosis not present

## 2023-09-14 DIAGNOSIS — Z13220 Encounter for screening for lipoid disorders: Secondary | ICD-10-CM | POA: Diagnosis not present

## 2023-09-14 DIAGNOSIS — E119 Type 2 diabetes mellitus without complications: Secondary | ICD-10-CM | POA: Diagnosis not present

## 2023-12-30 DIAGNOSIS — I10 Essential (primary) hypertension: Secondary | ICD-10-CM | POA: Diagnosis not present

## 2023-12-30 DIAGNOSIS — E1165 Type 2 diabetes mellitus with hyperglycemia: Secondary | ICD-10-CM | POA: Diagnosis not present

## 2023-12-30 DIAGNOSIS — E782 Mixed hyperlipidemia: Secondary | ICD-10-CM | POA: Diagnosis not present

## 2024-01-27 DIAGNOSIS — G479 Sleep disorder, unspecified: Secondary | ICD-10-CM | POA: Diagnosis not present

## 2024-01-27 DIAGNOSIS — I10 Essential (primary) hypertension: Secondary | ICD-10-CM | POA: Diagnosis not present

## 2024-01-27 DIAGNOSIS — E1165 Type 2 diabetes mellitus with hyperglycemia: Secondary | ICD-10-CM | POA: Diagnosis not present

## 2024-01-27 DIAGNOSIS — E782 Mixed hyperlipidemia: Secondary | ICD-10-CM | POA: Diagnosis not present

## 2024-02-17 DIAGNOSIS — H35371 Puckering of macula, right eye: Secondary | ICD-10-CM | POA: Diagnosis not present

## 2024-02-17 DIAGNOSIS — H33191 Other retinoschisis and retinal cysts, right eye: Secondary | ICD-10-CM | POA: Diagnosis not present

## 2024-02-17 DIAGNOSIS — H33101 Unspecified retinoschisis, right eye: Secondary | ICD-10-CM | POA: Diagnosis not present

## 2024-02-17 DIAGNOSIS — H35341 Macular cyst, hole, or pseudohole, right eye: Secondary | ICD-10-CM | POA: Diagnosis not present

## 2024-03-09 DIAGNOSIS — I10 Essential (primary) hypertension: Secondary | ICD-10-CM | POA: Diagnosis not present

## 2024-03-09 DIAGNOSIS — E1165 Type 2 diabetes mellitus with hyperglycemia: Secondary | ICD-10-CM | POA: Diagnosis not present

## 2024-03-14 NOTE — Progress Notes (Signed)
 Zoe Cooper                                          MRN: [id number]   03/14/2024   The [facility] Quality Team Specialist reviewed this patient medical record for the purposes of chart review for care gap closure. The following were reviewed: chart review for care gap closure-breast cancer screening and colorectal cancer screening.    [facility] Quality Team
